# Patient Record
Sex: Male | Born: 1955
Health system: Southern US, Community
[De-identification: ages and names within clinical notes are randomized; demographics above are authoritative.]

## PROBLEM LIST (undated history)

## (undated) DIAGNOSIS — I10 Essential (primary) hypertension: Secondary | ICD-10-CM

## (undated) DIAGNOSIS — K219 Gastro-esophageal reflux disease without esophagitis: Secondary | ICD-10-CM

## (undated) DIAGNOSIS — L409 Psoriasis, unspecified: Secondary | ICD-10-CM

## (undated) HISTORY — DX: Psoriasis, unspecified: L40.9

## (undated) HISTORY — PX: UVULECTOMY: SHX2631

## (undated) HISTORY — DX: Essential (primary) hypertension: I10

## (undated) HISTORY — PX: TONSILLECTOMY: SUR1361

## (undated) HISTORY — PX: APPENDECTOMY: SHX54

## (undated) HISTORY — PX: NASAL SEPTUM SURGERY: SHX37

## (undated) HISTORY — DX: Gastro-esophageal reflux disease without esophagitis: K21.9

---

## 2014-09-08 ENCOUNTER — Ambulatory Visit (INDEPENDENT_AMBULATORY_CARE_PROVIDER_SITE_OTHER): Payer: 59 | Admitting: Family Medicine

## 2014-09-08 ENCOUNTER — Other Ambulatory Visit: Payer: Self-pay

## 2014-09-08 ENCOUNTER — Encounter: Payer: Self-pay | Admitting: Pharmacist

## 2014-09-08 ENCOUNTER — Ambulatory Visit (HOSPITAL_COMMUNITY)
Admission: RE | Admit: 2014-09-08 | Discharge: 2014-09-08 | Disposition: A | Discharge status: Home / Self Care | Payer: 59 | Source: Ambulatory Visit | Attending: Family Medicine | Admitting: Family Medicine

## 2014-09-08 ENCOUNTER — Ambulatory Visit
Admission: RE | Admit: 2014-09-08 | Discharge: 2014-09-08 | Disposition: A | Discharge status: Home / Self Care | Payer: 59 | Source: Ambulatory Visit | Attending: Family Medicine | Admitting: Family Medicine

## 2014-09-08 ENCOUNTER — Ambulatory Visit (INDEPENDENT_AMBULATORY_CARE_PROVIDER_SITE_OTHER): Payer: 59 | Admitting: Pharmacist

## 2014-09-08 VITALS — Ht 75.0 in | Wt 232.0 lb

## 2014-09-08 VITALS — BP 136/78

## 2014-09-08 DIAGNOSIS — Y9315 Activity, underwater diving and snorkeling: Secondary | ICD-10-CM

## 2014-09-08 DIAGNOSIS — Z0289 Encounter for other administrative examinations: Secondary | ICD-10-CM | POA: Diagnosis present

## 2014-09-08 DIAGNOSIS — Z025 Encounter for examination for participation in sport: Secondary | ICD-10-CM

## 2014-09-08 LAB — GLUCOSE, CAPILLARY: Glucose-Capillary: 102 mg/dL — ABNORMAL HIGH (ref 70–99)

## 2014-09-08 LAB — POCT UA - GLUCOSE/PROTEIN
Glucose, UA: NEGATIVE
PROTEIN UA: NEGATIVE

## 2014-09-08 LAB — POCT HEMOGLOBIN: HEMOGLOBIN: 13.6 g/dL — AB (ref 14.1–18.1)

## 2014-09-08 NOTE — Progress Notes (Signed)
S:    Patient arrives appearing in good spirits. Presents for lung function evaluation for "dive physical". Patient reports breathing has been normal.   Denies history or breathing difficulty.   O: Patient provided good effort while attempting spirometry.  FVC 5.28    Calculated Lower Limit for NOAA Diving Standards   FVC = 5.42 FEV1 4.56       Calculated Lower Limit for NOAA Diving Standards   FEV1= 3.47 FEV1/FVC 0.86  Calculated Lower Limit for NOAA Diving Standards   FEV1/FVC = 0.64     See "scanned report" or Documentation Flowsheet (discrete results - PFTs) for  Spirometry results and copy of evaluation.   A/P: Spirometry evaluation without bronchodilator reveals normal lung function.  FEV1, FVC and FEV1/FVC ratio are in the range of goal spirometric parameters. Reviewed results of pulmonary function tests with patient and provided to Dr. Mingo Amber for review.   Pt verbalized understanding of results and education.  Patient seen with Randell Patient, PharmD Candidate and Milus Glazier, PharmD Resident.

## 2014-09-08 NOTE — Progress Notes (Signed)
Subjective:    Lawrence Wang is a 58 y.o. male who presents to [facility] today for scuba diving physical for the [facility]:  1.  Diving physical:  First certified in [place] age 54 or 72, active diver since that time.  Denies any complications or injuries while diving.  Has never before taken a fitness to dive physical.  Has never had issues with physical in past.  Followed by physician here in [place].  Normal lipid studies there.  Currently well, without complaints.  The following portions of the patient's history were reviewed and updated as appropriate: allergies, current medications, past medical history, family and social history, and problem list.  PMH reviewed.  No past medical history on file. No past surgical history on file.  Medications reviewed. No current outpatient prescriptions on file.   No current facility-administered medications for this visit.     PMH:   - None to report - No other hospitalizations or other prior medical history   PSH: - tonsillectomy as a child.  Appendectomy as a child.    Family History: - noncontributory  Social: - Never smoker - Denies illicit drug use - Very occasional social drinker (1-2 drinks/week)  SCUBA ROS:  He denies any history of middle ear trauma/disease, vertigo, ocular surgery, asthma or other respiratory issues, seizures, loss of consciousness, recurring neurologic disorders, history of head injury, coagulopathies, evidence of CAD or other structural heart disease, pneumothorax, diabetes, or exercise intolerance.    General ROS:  The patient denies fever, unusual weight change, decreased hearing, chest pain, palpitations, pre-syncopal or syncopal episodes, dyspnea on exertion, prolonged cough, hemoptysis, change in bowel habits, melena, hematochezia, severe indigestion/heartburn, nausea/vomiting/abdominal pain, genital sores, muscle weakness, difficulty walking, abnormal bleeding, or enlarged lymph nodes.      Objective:   Physical Exam There were no vitals taken for this visit. Gen:  Alert, cooperative patient who appears stated age in no acute distress.  Vital signs reviewed. Head:  [place]/AT Eyes:  Fundoscopy WNL BL.  PERRL, EOMI Ears:  External ears WNL, Bilateral TM's normal without retraction, redness or bulging.  Canals clear BL  Mouth:  Good dental hygiene. Tonsils non-erythematous, non-edematous.   MMM Neck:  Trachea midline Cardiac:  Regular rate and rhythm without murmur auscultated.   Pulm:  Clear to auscultation bilaterally with good air movement throuhout.  No wheezes or rales noted.   Abd:  Soft/nondistended/nontender.  Good bowel sounds throughout all four quadrants.  No masses noted.  Exts: No edema BL LE's, warm and well-perfused Neuro:  Alert and oriented to person, place, and date.  CN II-XII intact.  Sensation intact to light touch and vibration bilateral upper and lower extremities equally.  Motor function equal and strength 5/5 bilateral upper and lower extremities.  Normal gait.  DTRs +2 BL patellar, and achilles.  Finger to nose cerebellar testing within normal limits.  Color vision testing normal. Psych:  Not depressed or anxious appearing.  Linear and coherent thought process as evidenced by speech pattern. Smiles spontaneously.   Results for orders placed or performed in visit on 09/08/14 (from the past 72 hour(s))  Glucose, capillary     Status: Abnormal   Collection Time: 09/08/14  9:49 AM  Result Value Ref Range   Glucose-Capillary 102 (H) 70 - 99 mg/dL

## 2014-09-08 NOTE — Patient Instructions (Signed)
Normal spirometry

## 2014-09-08 NOTE — Assessment & Plan Note (Signed)
Spirometry evaluation without bronchodilator reveals normal lung function.  FEV1, FVC and FEV1/FVC ratio are in the range of goal spirometric parameters. Reviewed results of pulmonary function tests with patient and provided to Dr. Mingo Amber for review.   Pt verbalized understanding of results and education.  Patient seen with Randell Patient, PharmD Candidate and Milus Glazier, PharmD Resident.

## 2014-09-08 NOTE — Assessment & Plan Note (Signed)
Vision (distance, near, color), hearing, UA, CBG, Hgb all within normal limits. Normal CXR today. Spirometry is borderline.  Cleared for year -- needs to be rechecked next year.   EKG:  NSR, no ischemia or other issues Approval for SCUBA diving, I find no medical conditions considered incompatible with diving. Due to age and borderline PFTs, he qualifies for 1 year certification.

## 2014-09-08 NOTE — Patient Instructions (Signed)
It was good to meet you today.  Follow up with me in a year.    I have completed your paperwork.

## 2014-09-11 NOTE — Progress Notes (Signed)
Patient ID: Lawrence Wang, male   DOB: 1956/02/02, 59 y.o.   MRN: [id number] Reviewed:  Agree with Dr. Graylin Shiver documentation and management.

## 2014-11-11 ENCOUNTER — Encounter: Payer: Self-pay | Admitting: Physician Assistant

## 2014-11-11 ENCOUNTER — Ambulatory Visit (INDEPENDENT_AMBULATORY_CARE_PROVIDER_SITE_OTHER): Payer: 59 | Admitting: Physician Assistant

## 2014-11-11 VITALS — BP 142/88 | HR 64 | Wt 234.0 lb

## 2014-11-11 DIAGNOSIS — R300 Dysuria: Secondary | ICD-10-CM | POA: Diagnosis not present

## 2014-11-11 DIAGNOSIS — Z87440 Personal history of urinary (tract) infections: Secondary | ICD-10-CM

## 2014-11-11 LAB — POCT URINALYSIS DIPSTICK
Bilirubin, UA: NEGATIVE
Glucose, UA: NEGATIVE
KETONES UA: NEGATIVE
Leukocytes, UA: NEGATIVE
Nitrite, UA: NEGATIVE
PH UA: 6.5
PROTEIN UA: NEGATIVE
RBC UA: NEGATIVE
UROBILINOGEN UA: 0.2

## 2014-11-11 LAB — POCT UA - MICROSCOPIC ONLY
Bacteria, U Microscopic: NEGATIVE
Casts, Ur, LPF, POC: NEGATIVE
Crystals, Ur, HPF, POC: NEGATIVE
Mucus, UA: NEGATIVE
RBC, URINE, MICROSCOPIC: NEGATIVE
WBC, UR, HPF, POC: NEGATIVE
Yeast, UA: NEGATIVE

## 2014-11-11 NOTE — Progress Notes (Signed)
11/11/2014 at 4:43 PM  Lawrence Wang / DOB: [birth year] / MRN: [id number]  The patient has Scuba diving on his problem list.  SUBJECTIVE  Chief compalaint: No chief complaint on file.   History of present illness: Lawrence Wang is 59 y.o. well appearing male presenting for UTI symptoms.  This started 2 days ago with dysuria but he denies frequency and urgency.  He denies suprapubic pressure. Today he reports only slight discomfort, and is feeling better.  He deneis fever, chills, nausea and vomiting. He had a laboratory confirmed UTI on 7/31//15 at another urgent care and was treated with Cipro.    He reports having a normal PSA as his normal physical last week.  He reports having a vasectomy 21 years ago.  He has been married 28 years and is in a monogamous relationship.   He  has no past medical history on file.    He currently has no medications in their medication list.  Lawrence Wang has no allergies on file. He  reports that he has never smoked. He does not have any smokeless tobacco history on file. He  has no sexual activity history on file. The patient  has no past surgical history on file.  His family history is not on file.  Review of Systems  Constitutional: Negative.   HENT: Negative.   Respiratory: Negative.   Cardiovascular: Negative.   Genitourinary: Positive for dysuria. Negative for urgency, frequency, hematuria and flank pain.  Musculoskeletal: Negative for myalgias.  Skin: Negative.   Neurological: Negative.     OBJECTIVE  His  weight is 234 lb (106.142 kg). His blood pressure is 142/88 and his pulse is 64.  The patient's body mass index is 29.25 kg/(m^2).  Physical Exam  Constitutional: He is oriented to person, place, and time. He appears well-developed and well-nourished.  Cardiovascular: Normal rate.   Respiratory: Effort normal.  GI: Soft. Bowel sounds are normal. He exhibits no distension and no mass. There is no tenderness. There is no rebound and no guarding.   Genitourinary: Testes normal. Hypospadias (minimal displacement) present. No phimosis, paraphimosis, penile erythema or penile tenderness. No discharge found.  Musculoskeletal: Normal range of motion.  Neurological: He is alert and oriented to person, place, and time.  Skin: Skin is warm and dry. No rash noted. No erythema. No pallor.  Psychiatric: He has a normal mood and affect. His behavior is normal. Thought content normal.    Results for orders placed or performed in visit on 11/11/14 (from the past 24 hour(s))  POCT urinalysis dipstick     Status: None   Collection Time: 11/11/14  4:39 PM  Result Value Ref Range   Color, UA yellow    Clarity, UA clear    Glucose, UA neg    Bilirubin, UA neg    Ketones, UA neg    Spec Grav, UA <=1.005    Blood, UA neg    pH, UA 6.5    Protein, UA neg    Urobilinogen, UA 0.2    Nitrite, UA neg    Leukocytes, UA Negative   POCT UA - Microscopic Only     Status: None   Collection Time: 11/11/14  4:39 PM  Result Value Ref Range   WBC, Ur, HPF, POC neg    RBC, urine, microscopic neg    Bacteria, U Microscopic neg    Mucus, UA neg    Epithelial cells, urine per micros 0-1    Crystals, Ur, HPF, POC neg  Casts, Ur, LPF, POC neg    Yeast, UA neg     ASSESSMENT & PLAN  Diagnoses and all orders for this visit:  History of UTI: Awaiting confirmatory documentation from the urgent care which he was treated.  They agreed to fax [facility] these labs.  Orders: -     POCT urinalysis dipstick -     POCT UA - Microscopic Only  Dysuria: Urine does not point towards a UTI.  Will send out labs.  A stone is certainly possible, however the patient denies a history of this and does not have pain today, and the picture is confusing given his history of confirmed UTI. Will proceed with the following labs and will await urine culture. Should he develop these symptoms again in the future advised that he establish with urology.   Orders: -     Comprehensive  metabolic panel -     Calcium, ionized -     CBC with Differential/Platelet -     Urine culture -     POCT urinalysis dipstick -     POCT UA - Microscopic Only   The patient was advised to call or come back to clinic if he does not see an improvement in symptoms, or worsens with the above plan.   Philis Fendt, MHS, PA-C [facility] 11/11/2014 4:43 PM

## 2014-11-12 LAB — COMPREHENSIVE METABOLIC PANEL
ALK PHOS: 15 U/L — AB (ref 39–117)
ALT: 21 U/L (ref 0–53)
AST: 18 U/L (ref 0–37)
Albumin: 4.2 g/dL (ref 3.5–5.2)
BILIRUBIN TOTAL: 0.5 mg/dL (ref 0.2–1.2)
BUN: 13 mg/dL (ref 6–23)
CALCIUM: 9.6 mg/dL (ref 8.4–10.5)
CO2: 28 mEq/L (ref 19–32)
Chloride: 100 mEq/L (ref 96–112)
Creat: 0.84 mg/dL (ref 0.50–1.35)
Glucose, Bld: 84 mg/dL (ref 70–99)
POTASSIUM: 4.2 meq/L (ref 3.5–5.3)
Sodium: 137 mEq/L (ref 135–145)
TOTAL PROTEIN: 6.6 g/dL (ref 6.0–8.3)

## 2014-11-12 LAB — CBC WITH DIFFERENTIAL/PLATELET
Basophils Absolute: 0 10*3/uL (ref 0.0–0.1)
Basophils Relative: 0 % (ref 0–1)
Eosinophils Absolute: 0.2 10*3/uL (ref 0.0–0.7)
Eosinophils Relative: 3 % (ref 0–5)
HCT: 41.4 % (ref 39.0–52.0)
HEMOGLOBIN: 13.6 g/dL (ref 13.0–17.0)
LYMPHS ABS: 2.6 10*3/uL (ref 0.7–4.0)
Lymphocytes Relative: 34 % (ref 12–46)
MCH: 26.7 pg (ref 26.0–34.0)
MCHC: 32.9 g/dL (ref 30.0–36.0)
MCV: 81.3 fL (ref 78.0–100.0)
MONO ABS: 0.8 10*3/uL (ref 0.1–1.0)
MONOS PCT: 10 % (ref 3–12)
MPV: 8.9 fL (ref 8.6–12.4)
NEUTROS ABS: 4 10*3/uL (ref 1.7–7.7)
Neutrophils Relative %: 53 % (ref 43–77)
Platelets: 293 10*3/uL (ref 150–400)
RBC: 5.09 MIL/uL (ref 4.22–5.81)
RDW: 16.5 % — AB (ref 11.5–15.5)
WBC: 7.6 10*3/uL (ref 4.0–10.5)

## 2014-11-12 LAB — CALCIUM, IONIZED: Calcium, Ion: 1.27 mmol/L (ref 1.12–1.32)

## 2014-11-13 LAB — URINE CULTURE
COLONY COUNT: NO GROWTH
ORGANISM ID, BACTERIA: NO GROWTH

## 2015-04-19 ENCOUNTER — Encounter: Payer: Self-pay | Admitting: Family Medicine

## 2015-04-19 ENCOUNTER — Ambulatory Visit (INDEPENDENT_AMBULATORY_CARE_PROVIDER_SITE_OTHER): Payer: 59 | Admitting: Family Medicine

## 2015-04-19 VITALS — BP 108/70 | HR 94 | Temp 98.2°F | Ht 75.25 in | Wt 231.6 lb

## 2015-04-19 DIAGNOSIS — Z7189 Other specified counseling: Secondary | ICD-10-CM

## 2015-04-19 DIAGNOSIS — I10 Essential (primary) hypertension: Secondary | ICD-10-CM | POA: Diagnosis not present

## 2015-04-19 DIAGNOSIS — Z7689 Persons encountering health services in other specified circumstances: Secondary | ICD-10-CM

## 2015-04-19 DIAGNOSIS — K219 Gastro-esophageal reflux disease without esophagitis: Secondary | ICD-10-CM | POA: Diagnosis not present

## 2015-04-19 NOTE — Progress Notes (Signed)
Pre visit review using our clinic review tool, if applicable. No additional management support is needed unless otherwise documented below in the visit note. 

## 2015-04-19 NOTE — Progress Notes (Signed)
HPI:  Lawrence Wang is here to establish care. Was seeing Dr. Marlyn Corporal at [facility] but wife comes here. Had physical 2 months ago with labs.  Has the following chronic problems that require follow up and concerns today:  HTN: -reports diagnosed years ago and on benazepril -he wonders if he even needs the medication now - exercises more now -swims 3-4 days per week for 1 hour; diet is ok -denies: CP, SOB, DOE -denies any hx of kidney dz, heart problems, diabetes  GERD: -started about 5 years ago and has been on prilosec 20 mg daily for this -drinks a lot of caffeine -when tries to stop prilosec has bad heartburn -denies: hiatal hernia, esophageal sphincter, dysphagia  ROS negative for unless reported above: fevers, unintentional weight loss, hearing or vision loss, chest pain, palpitations, struggling to breath, hemoptysis, melena, hematochezia, hematuria, falls, loc, si, thoughts of self harm  Past Medical History  Diagnosis Date  . Hypertension   . GERD (gastroesophageal reflux disease)     Past Surgical History  Procedure Laterality Date  . Appendectomy    . Tonsillectomy    . Uvulectomy    . Nasal septum surgery      Family History  Problem Relation Age of Onset  . Heart block Father   . Cervical cancer Mother 65    Social History   Social History  . Marital Status: Married    Spouse Name: N/A  . Number of Children: N/A  . Years of Education: N/A   Social History Main Topics  . Smoking status: Never Smoker   . Smokeless tobacco: None  . Alcohol Use: 0.0 oz/week    0 Standard drinks or equivalent per week     Comment: glass of wine 4 nights per week  . Drug Use: No  . Sexual Activity: Not Asked   Other Topics Concern  . 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  benazepril (LOTENSIN) 20 MG tablet, Take 20 mg by mouth daily., Disp: , Rfl:  .  Flaxseed, Linseed, (FLAX SEEDS PO), Take by mouth., Disp: , Rfl:  .  Multiple Vitamin (MULTIVITAMIN) capsule, Take 1 capsule by mouth daily., Disp: , Rfl:  .  omeprazole (PRILOSEC) 20 MG capsule, Take 20 mg by mouth daily., Disp: , Rfl:   EXAM:  Filed Vitals:   04/19/15 0924  BP: 108/70  Pulse: 94  Temp: 98.2 F (36.8 C)    Body mass index is 28.77 kg/(m^2).  GENERAL: vitals reviewed and listed above, alert, oriented, appears well hydrated and in no acute distress  HEENT: atraumatic, conjunttiva clear, no obvious abnormalities on inspection of external nose and ears  NECK: no obvious masses on inspection  LUNGS: clear to auscultation bilaterally, no wheezes, rales or rhonchi, good air movement  CV: HRRR, no peripheral edema  MS: moves all extremities without noticeable abnormality  PSYCH: pleasant and cooperative, no obvious depression or anxiety  ASSESSMENT AND PLAN:  Discussed the following assessment and plan:  Essential hypertension -BP looks great today and he is doing aggressive exercise -he opted to try trial of BP medication, has cuff at home -advised to monitor  home BP 3 days per week and follow up nurse visit in 2 weeks to recheck here off medication  Gastroesophageal reflux disease, esophagitis presence not specified -discussed risks/benefits longterm PPI use -advised lifestyle changes -may try stoppin PPI or ranitidine if tolerates -follow up as needed  Encounter to establish care with new doctor -We reviewed the PMH, PSH, FH, SH, Meds and Allergies. -We provided refills for any medications we will prescribe as needed. -We addressed current concerns per orders and patient instructions. -We have asked for records for pertinent exams, studies, vaccines and notes from previous providers. -We have  advised patient to follow up per instructions below. -offered flu vaccine, he plans to do at follow up BP check   Asked him to obtain and provide vaccine record and recent labs/studies from prior PCP.  -Patient advised to return or notify a doctor immediately if symptoms worsen or persist or new concerns arise.  Patient Instructions  BEFORE YOU LEAVE: -schedule nurse visit in 2-4 weeks to recheck blood pressure of the medication -schedule follow up with me yearly  Try zantac(ranitidine) 75 mg and/or lifestyle changes for acid reflux instead of prilosec if tolerated  We recommend the following healthy lifestyle measures: - eat a healthy diet consisting of lots of vegetables, fruits, beans, nuts, seeds, healthy meats such as white chicken and fish and whole grains.  - avoid fried foods, fast food, processed foods, sodas, red meet and other fattening foods.  - get a least 150 minutes of aerobic exercise per week.   Food Choices for Gastroesophageal Reflux Disease When you have gastroesophageal reflux disease (GERD), the foods you eat and your eating habits are very important. Choosing the right foods can help ease the discomfort of GERD. WHAT GENERAL GUIDELINES DO I NEED TO FOLLOW?  Choose fruits, vegetables, whole grains, low-fat dairy products, and low-fat meat, fish, and poultry.  Limit fats such as oils, salad dressings, butter, nuts, and avocado.  Keep a food diary to identify foods that cause symptoms.  Avoid foods that cause reflux. These may be different for different people.  Eat frequent small meals instead of three large meals each day.  Eat your meals slowly, in a relaxed setting.  Limit fried foods.  Cook foods using methods other than frying.  Avoid drinking alcohol.  Avoid drinking large amounts of liquids with your meals.  Avoid bending over or lying down until 2-3 hours after eating. WHAT FOODS ARE NOT RECOMMENDED? The following are some foods and drinks  that may worsen your symptoms: Vegetables Tomatoes. Tomato juice. Tomato and spaghetti sauce. Chili peppers. Onion and garlic. Horseradish. Fruits Oranges, grapefruit, and lemon (fruit and juice). Meats High-fat meats, fish, and poultry. This includes hot dogs, ribs, ham, sausage, salami, and bacon. Dairy Whole milk and chocolate milk. Sour cream. Cream. Butter. Ice cream. Cream cheese.  Beverages Coffee and tea, with or without caffeine. Carbonated beverages or energy drinks. Condiments Hot sauce. Barbecue sauce.  Sweets/Desserts Chocolate and cocoa. Donuts. Peppermint and spearmint. Fats and Oils High-fat foods, including [place] fries and potato chips. Other Vinegar. Strong spices, such as black pepper, white pepper, red pepper, cayenne, curry powder, cloves, ginger, and chili powder. The items listed above may not be a complete list of foods and beverages to avoid. Contact your dietitian for more information. Document Released: 08/11/2005 Document Revised: 08/16/2013 Document Reviewed: 06/15/2013 [facility] Patient Information 2015 [place], [place]. This information is not intended to replace advice given to you by your health care provider. Make sure  you discuss any questions you have with your health care provider.      Colin Benton R.

## 2015-04-19 NOTE — Patient Instructions (Signed)
BEFORE YOU LEAVE: -schedule nurse visit in 2-4 weeks to recheck blood pressure of the medication -schedule follow up with me yearly  Try zantac(ranitidine) 75 mg and/or lifestyle changes for acid reflux instead of prilosec if tolerated  We recommend the following healthy lifestyle measures: - eat a healthy diet consisting of lots of vegetables, fruits, beans, nuts, seeds, healthy meats such as white chicken and fish and whole grains.  - avoid fried foods, fast food, processed foods, sodas, red meet and other fattening foods.  - get a least 150 minutes of aerobic exercise per week.   Food Choices for Gastroesophageal Reflux Disease When you have gastroesophageal reflux disease (GERD), the foods you eat and your eating habits are very important. Choosing the right foods can help ease the discomfort of GERD. WHAT GENERAL GUIDELINES DO I NEED TO FOLLOW?  Choose fruits, vegetables, whole grains, low-fat dairy products, and low-fat meat, fish, and poultry.  Limit fats such as oils, salad dressings, butter, nuts, and avocado.  Keep a food diary to identify foods that cause symptoms.  Avoid foods that cause reflux. These may be different for different people.  Eat frequent small meals instead of three large meals each day.  Eat your meals slowly, in a relaxed setting.  Limit fried foods.  Cook foods using methods other than frying.  Avoid drinking alcohol.  Avoid drinking large amounts of liquids with your meals.  Avoid bending over or lying down until 2-3 hours after eating. WHAT FOODS ARE NOT RECOMMENDED? The following are some foods and drinks that may worsen your symptoms: Vegetables Tomatoes. Tomato juice. Tomato and spaghetti sauce. Chili peppers. Onion and garlic. Horseradish. Fruits Oranges, grapefruit, and lemon (fruit and juice). Meats High-fat meats, fish, and poultry. This includes hot dogs, ribs, ham, sausage, salami, and bacon. Dairy Whole milk and chocolate milk.  Sour cream. Cream. Butter. Ice cream. Cream cheese.  Beverages Coffee and tea, with or without caffeine. Carbonated beverages or energy drinks. Condiments Hot sauce. Barbecue sauce.  Sweets/Desserts Chocolate and cocoa. Donuts. Peppermint and spearmint. Fats and Oils High-fat foods, including [place] fries and potato chips. Other Vinegar. Strong spices, such as black pepper, white pepper, red pepper, cayenne, curry powder, cloves, ginger, and chili powder. The items listed above may not be a complete list of foods and beverages to avoid. Contact your dietitian for more information. Document Released: 08/11/2005 Document Revised: 08/16/2013 Document Reviewed: 06/15/2013 [facility] Patient Information 2015 [place], [place]. This information is not intended to replace advice given to you by your health care provider. Make sure you discuss any questions you have with your health care provider.

## 2015-05-02 ENCOUNTER — Encounter: Payer: 59 | Admitting: *Deleted

## 2015-05-02 NOTE — Progress Notes (Deleted)
  HPI:  ROS: See pertinent positives and negatives per HPI.  Past Medical History  Diagnosis Date  . Hypertension   . GERD (gastroesophageal reflux disease)     Past Surgical History  Procedure Laterality Date  . Appendectomy    . Tonsillectomy    . Uvulectomy    . Nasal septum surgery      Family History  Problem Relation Age of Onset  . Heart block Father   . Cervical cancer Mother 52    Social History   Social History  . Marital Status: Married    Spouse Name: N/A  . Number of Children: N/A  . Years of Education: N/A   Social History Main Topics  . Smoking status: Never Smoker   . Smokeless tobacco: Not on file  . Alcohol Use: 0.0 oz/week    0 Standard drinks or equivalent per week     Comment: glass of wine 4 nights per week  . Drug Use: No  . Sexual Activity: Not on file   Other Topics Concern  . 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  benazepril (LOTENSIN) 20 MG tablet, Take 20 mg by mouth daily., Disp: , Rfl:  .  Flaxseed, Linseed, (FLAX SEEDS PO), Take by mouth., Disp: , Rfl:  .  Multiple Vitamin (MULTIVITAMIN) capsule, Take 1 capsule by mouth daily., Disp: , Rfl:  .  omeprazole (PRILOSEC) 20 MG capsule, Take 20 mg by mouth daily., Disp: , Rfl:   EXAM:  There were no vitals filed for this visit.  There is no weight on file to calculate BMI.  GENERAL: vitals reviewed and listed above, alert, oriented, appears well hydrated and in no acute distress  HEENT: atraumatic, conjunttiva clear, no obvious abnormalities on inspection of external nose and ears  NECK: no obvious masses on inspection  LUNGS: clear to auscultation bilaterally, no wheezes, rales or rhonchi, good air  movement  CV: HRRR, no peripheral edema  MS: moves all extremities without noticeable abnormality  PSYCH: pleasant and cooperative, no obvious depression or anxiety  ASSESSMENT AND PLAN:  Discussed the following assessment and plan:  No diagnosis found.  -Patient advised to return or notify a doctor immediately if symptoms worsen or persist or new concerns arise.  There are no Patient Instructions on file for this visit.   Colin Benton R.

## 2015-05-02 NOTE — Progress Notes (Signed)
This encounter was created in error - please disregard.

## 2015-08-02 ENCOUNTER — Ambulatory Visit (INDEPENDENT_AMBULATORY_CARE_PROVIDER_SITE_OTHER): Payer: 59 | Admitting: Family Medicine

## 2015-08-02 ENCOUNTER — Encounter: Payer: Self-pay | Admitting: Family Medicine

## 2015-08-02 VITALS — BP 132/84 | HR 88 | Temp 98.2°F | Ht 75.25 in | Wt 235.1 lb

## 2015-08-02 DIAGNOSIS — K219 Gastro-esophageal reflux disease without esophagitis: Secondary | ICD-10-CM | POA: Diagnosis not present

## 2015-08-02 DIAGNOSIS — I10 Essential (primary) hypertension: Secondary | ICD-10-CM

## 2015-08-02 NOTE — Progress Notes (Signed)
Pre visit review using our clinic review tool, if applicable. No additional management support is needed unless otherwise documented below in the visit note. 

## 2015-08-02 NOTE — Progress Notes (Signed)
  HPI:  HTN: -on benazepril for years with prior PCP then changed lifestyle and wanted to try trial off, here to recheck -swims 3-4 days per week for 1 hour; diet is ok -denies: CP, SOB, DOE -denies any hx of kidney dz, heart problems, diabetes -reports BP at home in 12--130s/80s, occ 90  GERD: -started about 5 years ago and has been on prilosec 20 mg daily for this -drinks a lot of caffeine -when tries to stop prilosec has bad heartburn -denies: hiatal hernia, esophageal sphincter, dysphagia   ROS: See pertinent positives and negatives per HPI.  Past Medical History  Diagnosis Date  . Hypertension   . GERD (gastroesophageal reflux disease)     Past Surgical History  Procedure Laterality Date  . Appendectomy    . Tonsillectomy    . Uvulectomy    . Nasal septum surgery      Family History  Problem Relation Age of Onset  . Heart block Father   . Cervical cancer Mother 8    Social History   Social History  . Marital Status: Married    Spouse Name: N/A  . Number of Children: N/A  . Years of Education: N/A   Social History Main Topics  . Smoking status: Never Smoker   . Smokeless tobacco: None  . Alcohol Use: 0.0 oz/week    0 Standard drinks or equivalent per week     Comment: glass of wine 4 nights per week  . Drug Use: No  . Sexual Activity: Not Asked   Other Topics Concern  . 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  Flaxseed, Linseed, (FLAX SEEDS PO), Take by mouth., Disp: , Rfl:  .  Multiple Vitamin (MULTIVITAMIN) capsule, Take 1 capsule by mouth daily., Disp: , Rfl:  .  omeprazole (PRILOSEC) 20 MG capsule, Take 20 mg by mouth daily., Disp: , Rfl:   EXAM:  Filed Vitals:   08/02/15 0909   BP: 132/84  Pulse: 88  Temp: 98.2 F (36.8 C)    Body mass index is 29.2 kg/(m^2).  GENERAL: vitals reviewed and listed above, alert, oriented, appears well hydrated and in no acute distress  HEENT: atraumatic, conjunttiva clear, no obvious abnormalities on inspection of external nose and ears  NECK: no obvious masses on inspection  LUNGS: clear to auscultation bilaterally, no wheezes, rales or rhonchi, good air movement  CV: HRRR, no peripheral edema  MS: moves all extremities without noticeable abnormality  PSYCH: pleasant and cooperative, no obvious depression or anxiety  ASSESSMENT AND PLAN:  Discussed the following assessment and plan:  Essential hypertension  Gastroesophageal reflux disease, esophagitis presence not specified  -BP ok, borderline -discussed restarting BP meds versus continue with improved lifestyle changes and monitoring - opted to monitor -physical in 3-4 months -Patient advised to return or notify a doctor immediately if symptoms worsen or persist or new concerns arise.  There are no Patient Instructions on file for this visit.   Colin Benton R.

## 2015-10-03 DIAGNOSIS — R0982 Postnasal drip: Secondary | ICD-10-CM | POA: Diagnosis not present

## 2015-10-03 DIAGNOSIS — J019 Acute sinusitis, unspecified: Secondary | ICD-10-CM | POA: Diagnosis not present

## 2015-10-25 ENCOUNTER — Ambulatory Visit (INDEPENDENT_AMBULATORY_CARE_PROVIDER_SITE_OTHER): Payer: 59 | Admitting: Family Medicine

## 2015-10-25 ENCOUNTER — Encounter: Payer: Self-pay | Admitting: Family Medicine

## 2015-10-25 VITALS — BP 132/88 | HR 95 | Temp 98.1°F | Ht 75.25 in | Wt 233.5 lb

## 2015-10-25 DIAGNOSIS — J0100 Acute maxillary sinusitis, unspecified: Secondary | ICD-10-CM | POA: Diagnosis not present

## 2015-10-25 MED ORDER — AMOXICILLIN-POT CLAVULANATE 875-125 MG PO TABS: 1.0000 | ORAL_TABLET | Freq: Two times a day (BID) | ORAL | Status: DC

## 2015-10-25 MED FILL — AMOX TR-K CLV 875-125 MG TA: 875-125 | 10 days supply | Qty: 20 | Fill #0

## 2015-10-25 NOTE — Patient Instructions (Signed)
Reason take the antibiotic as instructed.  Start Afrin nasal spray twice daily for 5 days. Must do not use longer than 5 days.  Follow-up as needed.

## 2015-10-25 NOTE — Progress Notes (Signed)
HPI:  Lawrence Wang is a pleasant 60 year old here for an acute visit for sinusitis: -started: 3 weeks ago, now worsening -symptoms: Thick yellow nasal congestion, ear pressure and pain, sore throat, cough, now with intermittent maxillary sinus pain and tooth sensitivity -denies:fever, SOB, NVD -has tried: Nasal saline rinses and over-the-counter products -sick contacts/travel/risks: no reported flu, strep or tick exposure -He is going on a cruise in a few days ROS: See pertinent positives and negatives per HPI.  Past Medical History  Diagnosis Date  . Hypertension   . GERD (gastroesophageal reflux disease)     Past Surgical History  Procedure Laterality Date  . Appendectomy    . Tonsillectomy    . Uvulectomy    . Nasal septum surgery      Family History  Problem Relation Age of Onset  . Heart block Father   . Cervical cancer Mother 33    Social History   Social History  . Marital Status: Married    Spouse Name: N/A  . Number of Children: N/A  . Years of Education: N/A   Social History Main Topics  . Smoking status: Never Smoker   . Smokeless tobacco: None  . Alcohol Use: 0.0 oz/week    0 Standard drinks or equivalent per week     Comment: glass of wine 4 nights per week  . Drug Use: No  . Sexual Activity: Not Asked   Other Topics Concern  . 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  Flaxseed, Linseed, (FLAX SEEDS PO), Take by mouth., Disp: , Rfl:  .  Multiple Vitamin (MULTIVITAMIN) capsule, Take 1 capsule by mouth daily., Disp: , Rfl:  .  omeprazole (PRILOSEC) 20 MG capsule, Take 20 mg by mouth every other day. Alternates with Zantac, Disp: , Rfl:  .  RaNITidine HCl (ZANTAC PO), Take by mouth every other  day. Alternates with Prilosec, Disp: , Rfl:  .  amoxicillin-clavulanate (AUGMENTIN) 875-125 MG tablet, Take 1 tablet by mouth 2 (two) times daily., Disp: 20 tablet, Rfl: 0  EXAM:  Filed Vitals:   10/25/15 1604  BP: 132/88  Pulse: 95  Temp: 98.1 F (36.7 C)    Body mass index is 29 kg/(m^2).  GENERAL: vitals reviewed and listed above, alert, oriented, appears well hydrated and in no acute distress  HEENT: atraumatic, conjunttiva clear, no obvious abnormalities on inspection of external nose and ears, normal appearance of ear canals and TMs except for clear effusion on the left, R>L thick white nasal congestion, mild post oropharyngeal erythema with PND, no tonsillar edema or exudate, no sinus TTP  NECK: no obvious masses on inspection  LUNGS: clear to auscultation bilaterally, no wheezes, rales or rhonchi, good air movement  CV: HRRR, no peripheral edema  MS: moves all extremities without noticeable abnormality  PSYCH: pleasant and cooperative, no obvious depression or anxiety  ASSESSMENT AND PLAN:  Discussed the following assessment and plan:  Acute maxillary sinusitis, recurrence not specified  -Suspect maxillary sinusitis with eustachian tube dysfunction. Treat with antibiotic and short course Afrin nasal spray. -of course, we advised to return or notify a doctor immediately if symptoms worsen or persist or  new concerns arise.    Patient Instructions  Reason take the antibiotic as instructed.  Start Afrin nasal spray twice daily for 5 days. Must do not use longer than 5 days.  Follow-up as needed.     Colin Benton R.

## 2015-10-25 NOTE — Progress Notes (Signed)
Pre visit review using our clinic review tool, if applicable. No additional management support is needed unless otherwise documented below in the visit note. 

## 2016-01-30 ENCOUNTER — Encounter: Payer: 59 | Admitting: Family Medicine

## 2016-02-29 DIAGNOSIS — H524 Presbyopia: Secondary | ICD-10-CM | POA: Diagnosis not present

## 2016-03-13 ENCOUNTER — Ambulatory Visit (INDEPENDENT_AMBULATORY_CARE_PROVIDER_SITE_OTHER): Payer: 59 | Admitting: Family Medicine

## 2016-03-13 VITALS — BP 126/80 | HR 84 | Wt 230.3 lb

## 2016-03-13 DIAGNOSIS — Z299 Encounter for prophylactic measures, unspecified: Secondary | ICD-10-CM

## 2016-03-13 DIAGNOSIS — Z418 Encounter for other procedures for purposes other than remedying health state: Secondary | ICD-10-CM

## 2016-03-13 DIAGNOSIS — Z23 Encounter for immunization: Secondary | ICD-10-CM | POA: Diagnosis not present

## 2016-03-13 DIAGNOSIS — K219 Gastro-esophageal reflux disease without esophagitis: Secondary | ICD-10-CM

## 2016-03-13 DIAGNOSIS — I10 Essential (primary) hypertension: Secondary | ICD-10-CM | POA: Insufficient documentation

## 2016-03-13 DIAGNOSIS — Z0000 Encounter for general adult medical examination without abnormal findings: Secondary | ICD-10-CM

## 2016-03-13 LAB — LIPID PANEL
CHOL/HDL RATIO: 5
CHOLESTEROL: 205 mg/dL — AB (ref 0–200)
HDL: 41.4 mg/dL (ref >39.00)
LDL CALC: 135 mg/dL — AB (ref 0–99)
NonHDL: 163.62
TRIGLYCERIDES: 145 mg/dL (ref 0.0–149.0)
VLDL: 29 mg/dL (ref 0.0–40.0)

## 2016-03-13 LAB — HEMOGLOBIN A1C: Hgb A1c MFr Bld: 5.5 % (ref 4.6–6.5)

## 2016-03-13 NOTE — Progress Notes (Signed)
HPI:  Here for CPE:  -Concerns and/or follow up today: none  -Diet: variety of foods, balance and well rounded, larger portion sizes  -Exercise: regular exercise - swimmer  -Diabetes and Dyslipidemia Screening:  -Hx of HTN: no  -Vaccines:due for Tdap  -wants STI testing, Hep C screening (if born 55-1963): no  -FH colon or prstate ca: see FH Last colon cancer screening: utd Last prostate ca screening: declined after discussion risks/benefits  -Alcohol, Tobacco, drug use: see social history  Review of Systems - no fevers, unintentional weight loss, vision loss, hearing loss, chest pain, sob, hemoptysis, melena, hematochezia, hematuria, genital discharge, changing or concerning skin lesions, bleeding, bruising, loc, thoughts of self harm or SI  Past Medical History  Diagnosis Date  . Hypertension   . GERD (gastroesophageal reflux disease)     Past Surgical History  Procedure Laterality Date  . Appendectomy    . Tonsillectomy    . Uvulectomy    . Nasal septum surgery      Family History  Problem Relation Age of Onset  . Heart block Father   . Cervical cancer Mother 19    Social History   Social History  . Marital Status: Married    Spouse Name: N/A  . Number of Children: N/A  . Years of Education: N/A   Social History Main Topics  . Smoking status: Never Smoker   . Smokeless tobacco: Not on file  . Alcohol Use: 0.0 oz/week    0 Standard drinks or equivalent per week     Comment: glass of wine 4 nights per week  . Drug Use: No  . Sexual Activity: Not on file   Other Topics Concern  . 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  amoxicillin-clavulanate (AUGMENTIN) 875-125 MG tablet,  Take 1 tablet by mouth 2 (two) times daily., Disp: 20 tablet, Rfl: 0 .  Flaxseed, Linseed, (FLAX SEEDS PO), Take by mouth., Disp: , Rfl:  .  Multiple Vitamin (MULTIVITAMIN) capsule, Take 1 capsule by mouth daily., Disp: , Rfl:  .  omeprazole (PRILOSEC) 20 MG capsule, Take 20 mg by mouth every other day. Alternates with Zantac, Disp: , Rfl:  .  RaNITidine HCl (ZANTAC PO), Take by mouth every other day. Alternates with Prilosec, Disp: , Rfl:   EXAM:  Filed Vitals:   03/13/16 0745  BP: 126/80  Pulse: 84  Weight: 230 lb 4.8 oz (104.463 kg)    Estimated body mass index is 28.6 kg/(m^2) as calculated from the following:   Height as of 10/25/15: 6' 3.25" (1.911 m).   Weight as of this encounter: 230 lb 4.8 oz (104.463 kg).  GENERAL: vitals reviewed and listed below, alert, oriented, appears well hydrated and in no acute distress  HEENT: head atraumatic, PERRLA, normal appearance of eyes, ears, nose and mouth. moist mucus membranes.  NECK: supple, no masses or lymphadenopathy  LUNGS: clear to auscultation bilaterally, no rales, rhonchi or wheeze  CV: HRRR, no peripheral edema or cyanosis, normal pedal pulses  ABDOMEN: bowel sounds normal, soft, non tender to palpation, no masses, no rebound or guarding  GU: declined  RECTAL: refused  SKIN: no rash or abnormal lesions; declined full skin exam  MS: normal gait, moves all extremities normally  NEURO: normal gait, speech and thought processing grossly intact, muscle tone grossly intact throughout  PSYCH: normal affect, pleasant and cooperative  ASSESSMENT AND PLAN:  Discussed the following assessment and plan: Visit for preventive health examination - Plan: Lipid panel, Hemoglobin A1c  Gastroesophageal reflux disease, esophagitis presence not specified stable  -Discussed and advised all [place] preventive services health task force level A and B recommendations for age, sex and risks.  --Advised at least 150 minutes of exercise per  week and a healthy diet with avoidance of (less then 1 serving per week) processed foods, white starches, red meat, fast foods and sweets and consisting of: * 5-9 servings of fresh fruits and vegetables (not corn or potatoes) *nuts and seeds, beans *olives and olive oil *lean meats such as fish and white chicken  *whole grains  -FASTING labs, studies and vaccines per orders this encounter   Patient advised to return to clinic immediately if symptoms worsen or persist or new concerns.  Patient Instructions  BEFORE YOU LEAVE: -follow up: yearly and as needed -tdap -labs  Monitor Blood pressure every few months   We recommend the following healthy lifestyle: 1) Small portions - eat off of salad plate instead of dinner plate 2) Eat a healthy clean diet with avoidance of (less then 1 serving per week) processed foods, sweetened drinks, white starches, red meat, fast foods and sweets and consisting of: * 5-9 servings per day of fresh or frozen fruits and vegetables (not corn or potatoes, not dried or canned) *nuts and seeds, beans *olives and olive oil *small portions of lean meats such as fish and white chicken  *small portions of whole grains 3)Get at least 150 minutes of sweaty aerobic exercise per week 4)reduce stress - counseling, meditation, relaxation to balance other aspects of your life  We have ordered labs or studies at this visit. It can take up to 1-2 weeks for results and processing. IF results require follow up or explanation, we will call you with instructions. Clinically stable results will be released to your [facility]. If you have not heard from [place] or cannot find your results in [facility] in 2 weeks please contact our office at [phone].  If you are not yet signed up for [facility], please consider signing up.            No Follow-up on file.   Colin Benton R., DO

## 2016-03-13 NOTE — Patient Instructions (Signed)
BEFORE YOU LEAVE: -follow up: yearly and as needed -tdap -labs  Monitor Blood pressure every few months   We recommend the following healthy lifestyle: 1) Small portions - eat off of salad plate instead of dinner plate 2) Eat a healthy clean diet with avoidance of (less then 1 serving per week) processed foods, sweetened drinks, white starches, red meat, fast foods and sweets and consisting of: * 5-9 servings per day of fresh or frozen fruits and vegetables (not corn or potatoes, not dried or canned) *nuts and seeds, beans *olives and olive oil *small portions of lean meats such as fish and white chicken  *small portions of whole grains 3)Get at least 150 minutes of sweaty aerobic exercise per week 4)reduce stress - counseling, meditation, relaxation to balance other aspects of your life  We have ordered labs or studies at this visit. It can take up to 1-2 weeks for results and processing. IF results require follow up or explanation, we will call you with instructions. Clinically stable results will be released to your [facility]. If you have not heard from [place] or cannot find your results in [facility] in 2 weeks please contact our office at [phone].  If you are not yet signed up for [facility], please consider signing up.

## 2016-03-13 NOTE — Addendum Note (Signed)
Addended by: Jerl Santos R on: 03/13/2016 07:59 AM   Modules accepted: Orders

## 2016-06-13 ENCOUNTER — Ambulatory Visit (INDEPENDENT_AMBULATORY_CARE_PROVIDER_SITE_OTHER): Payer: 59 | Admitting: Pharmacist

## 2016-06-13 ENCOUNTER — Ambulatory Visit (HOSPITAL_COMMUNITY)
Admission: RE | Admit: 2016-06-13 | Discharge: 2016-06-13 | Disposition: A | Discharge status: Home / Self Care | Payer: 59 | Source: Ambulatory Visit | Attending: Family Medicine | Admitting: Family Medicine

## 2016-06-13 ENCOUNTER — Other Ambulatory Visit: Payer: Self-pay

## 2016-06-13 ENCOUNTER — Ambulatory Visit (INDEPENDENT_AMBULATORY_CARE_PROVIDER_SITE_OTHER): Payer: 59 | Admitting: Family Medicine

## 2016-06-13 ENCOUNTER — Encounter: Payer: Self-pay | Admitting: Pharmacist

## 2016-06-13 ENCOUNTER — Encounter: Payer: Self-pay | Admitting: Family Medicine

## 2016-06-13 VITALS — BP 139/83 | HR 79 | Ht 76.5 in | Wt 227.8 lb

## 2016-06-13 DIAGNOSIS — Z0000 Encounter for general adult medical examination without abnormal findings: Secondary | ICD-10-CM | POA: Diagnosis not present

## 2016-06-13 DIAGNOSIS — Y9315 Activity, underwater diving and snorkeling: Secondary | ICD-10-CM

## 2016-06-13 LAB — POCT UA - GLUCOSE/PROTEIN: GLUCOSE UA: NEGATIVE

## 2016-06-13 LAB — POCT HEMOGLOBIN: Hemoglobin: 15.3 g/dL (ref 14.1–18.1)

## 2016-06-13 LAB — GLUCOSE, CAPILLARY: GLUCOSE-CAPILLARY: 113 mg/dL — AB (ref 65–99)

## 2016-06-13 NOTE — Progress Notes (Signed)
S:    Patient arrives in no apparent distress, ambulating on own with no noticeable alterations. Presents for lung function evaluation for "dive physical". Patient reports breathing has been normal. Denies history of breathing difficulty.   O: Patient provided good effort while attempting spirometry.  FVC 5.23    Calculated Lower Limit for [employer] Diving Standards   FVC = 5.45 FEV1 4.42       Calculated Lower Limit for [employer] Diving Standards   FEV1= 3.43 FEV1/FVC  85   Calculated Lower Limit for [employer] Diving Standards   FEV1/FVC = 63  See "scanned report" or Documentation Flowsheet (discrete results - PFTs) for  Spirometry results and copy of evaluation.   A/P: Spirometry evaluation without bronchodilator reveals near normal lung function.  FEV1 and FEV1/FVC ratio both exceed threshold for spirometric parameters. FVC within 0.2 L of goal.  Reviewed results of pulmonary function tests.  Pt verbalized understanding of results and education.  Patient seen with Shonna Chock, PharmD Candidate.  Remainder of dive physical evaluation performed by Dr. Mingo Amber.

## 2016-06-13 NOTE — Patient Instructions (Signed)
Lung Function Assessment completed successfully.

## 2016-06-13 NOTE — Assessment & Plan Note (Signed)
Vision (distance, near, color), hearing, UA, CBG, Hgb, and Spirometry all within normal limits. Normal CXR within past 5 years. EKG:  Normal today; normal last visit. Coronary assessment:  Low risk. Borderline spirometry; same as last year, no decline.  Otherwise doing well and diving each week for past year without incident. Approval for SCUBA diving, I find no medical conditions considered incompatible with diving. Due to age and lack of medical conditions, he qualifies for 1 year certification.  Repeat spirometry in a year.

## 2016-06-13 NOTE — Progress Notes (Signed)
Subjective:    Lawrence Wang is a 60 y.o. male who presents to [facility] today for scuba diving physical for the [facility]:  1.  Diving physical:  First certified in [place] diving as teenager, active diver since that time.  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    Denies any complications or injuries while diving.  Has never failed a fitness to dive physical.  First was here last year, had borderline spirometry but otherwise normal EKG and normal CXR.  Currently well, without complaints.   PMH reviewed.  Past Medical History:  Diagnosis Date  . GERD (gastroesophageal reflux disease)   . Hypertension    Past Surgical History:  Procedure Laterality Date  . APPENDECTOMY    . NASAL SEPTUM SURGERY    . TONSILLECTOMY    . UVULECTOMY      Medications reviewed. Current Outpatient Prescriptions  Medication Sig Dispense Refill  . Flaxseed, Linseed, (FLAX SEEDS PO) Take by mouth.    . Multiple Vitamin (MULTIVITAMIN) capsule Take 1 capsule by mouth daily.    Marland Kitchen omeprazole (PRILOSEC) 20 MG capsule Take 20 mg by mouth every other day. Alternates with Zantac    . RaNITidine HCl (ZANTAC PO) Take by mouth every other day. Alternates with Prilosec     No current facility-administered medications for this visit.    Family History: - Cervical cancer in mother - Heart block in father.    Social: - Never smoker - Denies illicit drug use - Very occasional social drinker (several drinks wine per week)  SCUBA ROS:  He denies any history of middle ear trauma/disease, vertigo, ocular surgery, asthma or other respiratory issues, seizures, loss of consciousness, recurring neurologic disorders, history of head injury, coagulopathies, evidence of CAD or other structural heart disease, pneumothorax, diabetes, or exercise intolerance.    General ROS:  The patient denies fever, unusual weight change, decreased hearing, chest pain, palpitations, pre-syncopal or  syncopal episodes, dyspnea on exertion, prolonged cough, hemoptysis, change in bowel habits, melena, hematochezia, severe indigestion/heartburn, nausea/vomiting/abdominal pain, genital sores, muscle weakness, difficulty walking, abnormal bleeding, or enlarged lymph nodes.     Objective:   Physical Exam BP 139/83   Pulse 79   Ht 6' 4.5" (1.943 m)   Wt 227 lb 12.8 oz (103.3 kg)   BMI 27.37 kg/m  Gen:  Alert, cooperative patient who appears stated age in no acute distress.  Vital signs reviewed. Head:  [place]/AT Eyes: PERRL, EOMI Ears:  External ears WNL, Bilateral TM's normal without retraction, redness or bulging.  Canals clear BL  Mouth:  Good dental hygiene. Tonsils non-erythematous, non-edematous.   MMM Neck:  Trachea midline; supple Cardiac:  Regular rate and rhythm without murmur auscultated.   Pulm:  Clear to auscultation bilaterally with good air movement throuhout.  No wheezes or rales noted.   Abd:  Soft/nondistended/nontender.  Good bowel sounds throughout all four quadrants.  No masses noted.  Exts: No edema BL LE's, warm and well-perfused Neuro:  Alert and oriented to person, place, and date.  CN II-XII intact.  Sensation intact to light touch and vibration bilateral upper and lower extremities equally.  Motor function equal and strength 5/5 bilateral upper and lower extremities.  Normal gait.  DTRs +2 BL tricep, brachialis, patellar, and achilles.  Finger to nose cerebellar testing within normal limits.  Color vision testing normal. Psych:  Not depressed or anxious appearing.  Linear and coherent thought process as evidenced by speech pattern.  Smiles spontaneously.   No results found for this or any previous visit (from the past 72 hour(s)).

## 2016-06-13 NOTE — Assessment & Plan Note (Signed)
Spirometry evaluation without bronchodilator reveals near normal lung function.  FEV1 and FEV1/FVC ratio both exceed threshold for spirometric parameters. FVC within 0.2 L of goal.

## 2016-06-13 NOTE — Addendum Note (Signed)
Addended by: Leavy Cella on: 06/13/2016 03:31 PM   Modules accepted: Orders

## 2017-03-11 ENCOUNTER — Other Ambulatory Visit: Payer: 59

## 2017-03-17 NOTE — Progress Notes (Signed)
HPI:  Here for CPE: -had [place] CPE with Dr. Mingo Amber 05/2016 - but reports also does CPE here with me and this is ok with his insurance. Due for labs: lipids, ?PSA  -Concerns and/or follow up today:  PMH HTN (on acei in the past then was able to d/c with lifestyle changes), GERD, mild HLD.  -Diet: variety of foods, balance and well rounded  -Exercise:  regular exercise  -Diabetes and Dyslipidemia Screening: fasting  -Hx of HTN: no  -Vaccines: UTD  -wants STI testing, Hep C screening (if born 32-1963): no to STI screening, agrees to hep C screen  -FH colon or prstate ca: see FH Last colon cancer screening: UTD Last prostate ca screening: discussed options, risks, benefits; offered DRE and PSA - he declined  -Alcohol, Tobacco, drug use: see social history  Review of Systems - no fevers, unintentional weight loss, vision loss, hearing loss, chest pain, sob, hemoptysis, melena, hematochezia, hematuria, genital discharge, changing or concerning skin lesions, bleeding, bruising, loc, thoughts of self harm or SI  Past Medical History:  Diagnosis Date  . GERD (gastroesophageal reflux disease)   . Hypertension     Past Surgical History:  Procedure Laterality Date  . APPENDECTOMY    . NASAL SEPTUM SURGERY    . TONSILLECTOMY    . UVULECTOMY      Family History  Problem Relation Age of Onset  . Heart block Father   . Cervical cancer Mother 34    Social History   Social History  . Marital status: Married    Spouse name: N/A  . Number of children: N/A  . Years of education: N/A   Social History Main Topics  . Smoking status: Never Smoker  . Smokeless tobacco: Never Used  . Alcohol use 0.0 oz/week     Comment: glass of wine 4 nights per week  . Drug use: No  . Sexual activity: Not Asked   Other Topics Concern  . 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  Flaxseed, Linseed, (FLAX SEEDS PO), Take by mouth., Disp: , Rfl:  .  Multiple Vitamin (MULTIVITAMIN) capsule, Take 1 capsule by mouth daily., Disp: , Rfl:  .  omeprazole (PRILOSEC) 20 MG capsule, Take 20 mg by mouth every other day. Alternates with Zantac, Disp: , Rfl:  .  RaNITidine HCl (ZANTAC PO), Take by mouth every other day. Alternates with Prilosec, Disp: , Rfl:   EXAM:  Vitals:   03/19/17 0722  BP: 118/88  Pulse: 84  Temp: 98.5 F (36.9 C)  TempSrc: Oral  Weight: 228 lb 11.2 oz (103.7 kg)  Height: 6' 3.5" (1.918 m)    Estimated body mass index is 28.21 kg/m as calculated from the following:   Height as of this encounter: 6' 3.5" (1.918 m).   Weight as of this encounter: 228 lb 11.2 oz (103.7 kg).  GENERAL: vitals reviewed and listed below, alert, oriented, appears well hydrated and in no acute distress  HEENT: head atraumatic, PERRLA, normal appearance of eyes, ears, nose and mouth. moist mucus membranes.  NECK: supple, no masses or lymphadenopathy  LUNGS: clear to auscultation bilaterally, no rales, rhonchi  or wheeze  CV: HRRR, no peripheral edema or cyanosis, normal pedal pulses  ABDOMEN: bowel sounds normal, soft, non tender to palpation, no masses, no rebound or guarding  GU: normal appearance of external genitalia - no lesions or masses, hernia exam normal.  RECTAL: refused  SKIN: no rash or abnormal lesions  MS: normal gait, moves all extremities normally  NEURO: normal gait, speech and thought processing grossly intact, muscle tone grossly intact throughout  PSYCH: normal affect, pleasant and cooperative  ASSESSMENT AND PLAN:  Discussed the following assessment and plan:  Encounter for preventive health examination  Gastroesophageal reflux disease, esophagitis presence not  specified  Hyperlipidemia, unspecified hyperlipidemia type - Plan: Lipid panel  Encounter for HCV screening test for high risk patient - Plan: Hepatitis C antibody  -Discussed and advised all [place] preventive services health task force level A and B recommendations for age, sex and risks.  --Advised at least 150 minutes of exercise per week and a healthy diet with avoidance of (less then 1 serving per week) processed foods, white starches, red meat, fast foods and sweets and consisting of: * 5-9 servings of fresh fruits and vegetables (not corn or potatoes) *nuts and seeds, beans *olives and olive oil *lean meats such as fish and white chicken  *whole grains  -FASTING labs, studies and vaccines per orders this encounter   Patient advised to return to clinic immediately if symptoms worsen or persist or new concerns.  Patient Instructions  BEFORE YOU LEAVE: -recheck BP -labs -follow up: yearly and as needed  We have ordered labs or studies at this visit. It can take up to 1-2 weeks for results and processing. IF results require follow up or explanation, we will call you with instructions. Clinically stable results will be released to your [facility]. If you have not heard from [place] or cannot find your results in [facility] in 2 weeks please contact our office at [phone].  If you are not yet signed up for [facility], please consider signing up.   We recommend the following healthy lifestyle for LIFE: 1) Small portions. Regular healthy meals.  2) Eat a healthy clean diet.   TRY TO EAT: -at least 5-7 servings of low sugar vegetables per day (not corn, potatoes or bananas.) -berries are the best choice if you wish to eat fruit.   -lean meets (fish, chicken or [place] breasts) -vegan proteins for some meals - beans or tofu, whole grains, nuts and seeds -Replace bad fats with good fats - good fats include: fish, nuts and seeds, canola oil, olive oil -small amounts of low fat or non fat  dairy -small amounts of100 % whole grains - check the lables  AVOID: -SUGAR, sweets, anything with added sugar, corn syrup or sweeteners -if you must have a sweetener, small amounts of stevia may be best -sweetened beverages -simple starches (rice, bread, potatoes, pasta, chips, etc - small amounts of 100% whole grains are ok) -red meat, pork, butter -fried foods, fast food, processed food, excessive dairy, eggs and coconut.  3)Get at least 150 minutes of sweaty aerobic exercise per week.  4)Reduce stress - consider counseling, meditation and relaxation to balance other aspects of your life.    WE NOW OFFER   [facility] FAST TRACK!!!  SAME DAY Appointments for ACUTE CARE  Such as: Sprains, Injuries, cuts, abrasions, rashes, muscle pain, joint pain, back pain Colds, flu, sore throats, headache, allergies, cough, fever  Ear pain, sinus and eye infections Abdominal pain, nausea, vomiting, diarrhea,  upset stomach Animal/insect bites  3 Easy Ways to Schedule: Walk-In Scheduling Call in scheduling Mychart Sign-up: [email or web address]     Health Maintenance, Male A healthy lifestyle and preventive care is important for your health and wellness. Ask your health care provider about what schedule of regular examinations is right for you. What should I know about weight and diet? Eat a Healthy Diet  Eat plenty of vegetables, fruits, whole grains, low-fat dairy products, and lean protein.  Do not eat a lot of foods high in solid fats, added sugars, or salt.  Maintain a Healthy Weight Regular exercise can help you achieve or maintain a healthy weight. You should:  Do at least 150 minutes of exercise each week. The exercise should increase your heart rate and make you sweat (moderate-intensity exercise).  Do strength-training exercises at least twice a week.  Watch Your Levels of Cholesterol and Blood Lipids  Have your blood tested for lipids and  cholesterol every 5 years starting at 59 years of age. If you are at high risk for heart disease, you should start having your blood tested when you are 59 years old. You may need to have your cholesterol levels checked more often if: ? Your lipid or cholesterol levels are high. ? You are older than 59 years of age. ? You are at high risk for heart disease.  What should I know about cancer screening? Many types of cancers can be detected early and may often be prevented. Lung Cancer  You should be screened every year for lung cancer if: ? You are a current smoker who has smoked for at least 30 years. ? You are a former smoker who has quit within the past 15 years.  Talk to your health care provider about your screening options, when you should start screening, and how often you should be screened.  Colorectal Cancer  Routine colorectal cancer screening usually begins at 59 years of age and should be repeated every 5-10 years until you are 59 years old. You may need to be screened more often if early forms of precancerous polyps or small growths are found. Your health care provider may recommend screening at an earlier age if you have risk factors for colon cancer.  Your health care provider may recommend using home test kits to check for hidden blood in the stool.  A small camera at the end of a tube can be used to examine your colon (sigmoidoscopy or colonoscopy). This checks for the earliest forms of colorectal cancer.  Prostate and Testicular Cancer  Depending on your age and overall health, your health care provider may do certain tests to screen for prostate and testicular cancer.  Talk to your health care provider about any symptoms or concerns you have about testicular or prostate cancer.  Skin Cancer  Check your skin from head to toe regularly.  Tell your health care provider about any new moles or changes in moles, especially if: ? There is a change in a mole's size, shape,  or color. ? You have a mole that is larger than a pencil eraser.  Always use sunscreen. Apply sunscreen liberally and repeat throughout the day.  Protect yourself by wearing long sleeves, pants, a wide-brimmed hat, and sunglasses when outside.  What should I know about heart disease, diabetes, and high blood pressure?  If you are 44-45 years of age, have your blood pressure checked every 3-5 years. If you are 56 years of age or older, have your blood  pressure checked every year. You should have your blood pressure measured twice-once when you are at a hospital or clinic, and once when you are not at a hospital or clinic. Record the average of the two measurements. To check your blood pressure when you are not at a hospital or clinic, you can use: ? An automated blood pressure machine at a pharmacy. ? A home blood pressure monitor.  Talk to your health care provider about your target blood pressure.  If you are between 68-80 years old, ask your health care provider if you should take aspirin to prevent heart disease.  Have regular diabetes screenings by checking your fasting blood sugar level. ? If you are at a normal weight and have a low risk for diabetes, have this test once every three years after the age of 78. ? If you are overweight and have a high risk for diabetes, consider being tested at a younger age or more often.  A one-time screening for abdominal aortic aneurysm (AAA) by ultrasound is recommended for men aged 50-75 years who are current or former smokers. What should I know about preventing infection? Hepatitis B If you have a higher risk for hepatitis B, you should be screened for this virus. Talk with your health care provider to find out if you are at risk for hepatitis B infection. Hepatitis C Blood testing is recommended for:  Everyone born from [birth year] through 1963.  Anyone with known risk factors for hepatitis C.  Sexually Transmitted Diseases (STDs)  You should  be screened each year for STDs including gonorrhea and chlamydia if: ? You are sexually active and are younger than 59 years of age. ? You are older than 59 years of age and your health care provider tells you that you are at risk for this type of infection. ? Your sexual activity has changed since you were last screened and you are at an increased risk for chlamydia or gonorrhea. Ask your health care provider if you are at risk.  Talk with your health care provider about whether you are at high risk of being infected with HIV. Your health care provider may recommend a prescription medicine to help prevent HIV infection.  What else can I do?  Schedule regular health, dental, and eye exams.  Stay current with your vaccines (immunizations).  Do not use any tobacco products, such as cigarettes, chewing tobacco, and e-cigarettes. If you need help quitting, ask your health care provider.  Limit alcohol intake to no more than 2 drinks per day. One drink equals 12 ounces of beer, 5 ounces of wine, or 1 ounces of hard liquor.  Do not use street drugs.  Do not share needles.  Ask your health care provider for help if you need support or information about quitting drugs.  Tell your health care provider if you often feel depressed.  Tell your health care provider if you have ever been abused or do not feel safe at home. This information is not intended to replace advice given to you by your health care provider. Make sure you discuss any questions you have with your health care provider. Document Released: 02/07/2008 Document Revised: 04/09/2016 Document Reviewed: 05/15/2015 Elsevier Interactive Patient Education  2018 [employer].           No Follow-up on file.   Colin Benton R., DO

## 2017-03-19 ENCOUNTER — Ambulatory Visit (INDEPENDENT_AMBULATORY_CARE_PROVIDER_SITE_OTHER): Payer: 59 | Admitting: Family Medicine

## 2017-03-19 ENCOUNTER — Encounter: Payer: Self-pay | Admitting: Family Medicine

## 2017-03-19 VITALS — BP 118/88 | HR 84 | Temp 98.5°F | Ht 75.5 in | Wt 228.7 lb

## 2017-03-19 DIAGNOSIS — E785 Hyperlipidemia, unspecified: Secondary | ICD-10-CM

## 2017-03-19 DIAGNOSIS — Z0000 Encounter for general adult medical examination without abnormal findings: Secondary | ICD-10-CM

## 2017-03-19 DIAGNOSIS — Z9189 Other specified personal risk factors, not elsewhere classified: Secondary | ICD-10-CM

## 2017-03-19 DIAGNOSIS — Z1159 Encounter for screening for other viral diseases: Secondary | ICD-10-CM | POA: Diagnosis not present

## 2017-03-19 DIAGNOSIS — K219 Gastro-esophageal reflux disease without esophagitis: Secondary | ICD-10-CM

## 2017-03-19 LAB — LIPID PANEL
CHOL/HDL RATIO: 5
Cholesterol: 213 mg/dL — ABNORMAL HIGH (ref 0–200)
HDL: 42.8 mg/dL (ref >39.00)
LDL CALC: 142 mg/dL — AB (ref 0–99)
NONHDL: 170.41
Triglycerides: 140 mg/dL (ref 0.0–149.0)
VLDL: 28 mg/dL (ref 0.0–40.0)

## 2017-03-19 LAB — HEPATITIS C ANTIBODY: HCV Ab: NEGATIVE

## 2017-03-19 NOTE — Patient Instructions (Addendum)
BEFORE YOU LEAVE: -recheck BP -labs -follow up: yearly and as needed  We have ordered labs or studies at this visit. It can take up to 1-2 weeks for results and processing. IF results require follow up or explanation, we will call you with instructions. Clinically stable results will be released to your [facility]. If you have not heard from [place] or cannot find your results in [facility] in 2 weeks please contact our office at [phone].  If you are not yet signed up for [facility], please consider signing up.   We recommend the following healthy lifestyle for LIFE: 1) Small portions. Regular healthy meals.  2) Eat a healthy clean diet.   TRY TO EAT: -at least 5-7 servings of low sugar vegetables per day (not corn, potatoes or bananas.) -berries are the best choice if you wish to eat fruit.   -lean meets (fish, chicken or [place] breasts) -vegan proteins for some meals - beans or tofu, whole grains, nuts and seeds -Replace bad fats with good fats - good fats include: fish, nuts and seeds, canola oil, olive oil -small amounts of low fat or non fat dairy -small amounts of100 % whole grains - check the lables  AVOID: -SUGAR, sweets, anything with added sugar, corn syrup or sweeteners -if you must have a sweetener, small amounts of stevia may be best -sweetened beverages -simple starches (rice, bread, potatoes, pasta, chips, etc - small amounts of 100% whole grains are ok) -red meat, pork, butter -fried foods, fast food, processed food, excessive dairy, eggs and coconut.  3)Get at least 150 minutes of sweaty aerobic exercise per week.  4)Reduce stress - consider counseling, meditation and relaxation to balance other aspects of your life.    WE NOW OFFER   [facility] FAST TRACK!!!  SAME DAY Appointments for ACUTE CARE  Such as: Sprains, Injuries, cuts, abrasions, rashes, muscle pain, joint pain, back pain Colds, flu, sore throats, headache, allergies, cough, fever  Ear pain,  sinus and eye infections Abdominal pain, nausea, vomiting, diarrhea, upset stomach Animal/insect bites  3 Easy Ways to Schedule: Walk-In Scheduling Call in scheduling Mychart Sign-up: [email or web address]     Health Maintenance, Male A healthy lifestyle and preventive care is important for your health and wellness. Ask your health care provider about what schedule of regular examinations is right for you. What should I know about weight and diet? Eat a Healthy Diet  Eat plenty of vegetables, fruits, whole grains, low-fat dairy products, and lean protein.  Do not eat a lot of foods high in solid fats, added sugars, or salt.  Maintain a Healthy Weight Regular exercise can help you achieve or maintain a healthy weight. You should:  Do at least 150 minutes of exercise each week. The exercise should increase your heart rate and make you sweat (moderate-intensity exercise).  Do strength-training exercises at least twice a week.  Watch Your Levels of Cholesterol and Blood Lipids  Have your blood tested for lipids and cholesterol every 5 years starting at 59 years of age. If you are at high risk for heart disease, you should start having your blood tested when you are 59 years old. You may need to have your cholesterol levels checked more often if: ? Your lipid or cholesterol levels are high. ? You are older than 59 years of age. ? You are at high risk for heart disease.  What should I know about cancer screening? Many types of cancers can be detected early and may often be prevented.  Lung Cancer  You should be screened every year for lung cancer if: ? You are a current smoker who has smoked for at least 30 years. ? You are a former smoker who has quit within the past 15 years.  Talk to your health care provider about your screening options, when you should start screening, and how often you should be screened.  Colorectal Cancer  Routine colorectal cancer screening  usually begins at 59 years of age and should be repeated every 5-10 years until you are 59 years old. You may need to be screened more often if early forms of precancerous polyps or small growths are found. Your health care provider may recommend screening at an earlier age if you have risk factors for colon cancer.  Your health care provider may recommend using home test kits to check for hidden blood in the stool.  A small camera at the end of a tube can be used to examine your colon (sigmoidoscopy or colonoscopy). This checks for the earliest forms of colorectal cancer.  Prostate and Testicular Cancer  Depending on your age and overall health, your health care provider may do certain tests to screen for prostate and testicular cancer.  Talk to your health care provider about any symptoms or concerns you have about testicular or prostate cancer.  Skin Cancer  Check your skin from head to toe regularly.  Tell your health care provider about any new moles or changes in moles, especially if: ? There is a change in a mole's size, shape, or color. ? You have a mole that is larger than a pencil eraser.  Always use sunscreen. Apply sunscreen liberally and repeat throughout the day.  Protect yourself by wearing long sleeves, pants, a wide-brimmed hat, and sunglasses when outside.  What should I know about heart disease, diabetes, and high blood pressure?  If you are 85-63 years of age, have your blood pressure checked every 3-5 years. If you are 65 years of age or older, have your blood pressure checked every year. You should have your blood pressure measured twice-once when you are at a hospital or clinic, and once when you are not at a hospital or clinic. Record the average of the two measurements. To check your blood pressure when you are not at a hospital or clinic, you can use: ? An automated blood pressure machine at a pharmacy. ? A home blood pressure monitor.  Talk to your health care  provider about your target blood pressure.  If you are between 108-88 years old, ask your health care provider if you should take aspirin to prevent heart disease.  Have regular diabetes screenings by checking your fasting blood sugar level. ? If you are at a normal weight and have a low risk for diabetes, have this test once every three years after the age of 15. ? If you are overweight and have a high risk for diabetes, consider being tested at a younger age or more often.  A one-time screening for abdominal aortic aneurysm (AAA) by ultrasound is recommended for men aged 26-75 years who are current or former smokers. What should I know about preventing infection? Hepatitis B If you have a higher risk for hepatitis B, you should be screened for this virus. Talk with your health care provider to find out if you are at risk for hepatitis B infection. Hepatitis C Blood testing is recommended for:  Everyone born from [birth year] through 1963.  Anyone with known risk factors for  hepatitis C.  Sexually Transmitted Diseases (STDs)  You should be screened each year for STDs including gonorrhea and chlamydia if: ? You are sexually active and are younger than 59 years of age. ? You are older than 59 years of age and your health care provider tells you that you are at risk for this type of infection. ? Your sexual activity has changed since you were last screened and you are at an increased risk for chlamydia or gonorrhea. Ask your health care provider if you are at risk.  Talk with your health care provider about whether you are at high risk of being infected with HIV. Your health care provider may recommend a prescription medicine to help prevent HIV infection.  What else can I do?  Schedule regular health, dental, and eye exams.  Stay current with your vaccines (immunizations).  Do not use any tobacco products, such as cigarettes, chewing tobacco, and e-cigarettes. If you need help quitting, ask  your health care provider.  Limit alcohol intake to no more than 2 drinks per day. One drink equals 12 ounces of beer, 5 ounces of wine, or 1 ounces of hard liquor.  Do not use street drugs.  Do not share needles.  Ask your health care provider for help if you need support or information about quitting drugs.  Tell your health care provider if you often feel depressed.  Tell your health care provider if you have ever been abused or do not feel safe at home. This information is not intended to replace advice given to you by your health care provider. Make sure you discuss any questions you have with your health care provider. Document Released: 02/07/2008 Document Revised: 04/09/2016 Document Reviewed: 05/15/2015 Elsevier Interactive Patient Education  [employer].

## 2017-04-13 ENCOUNTER — Encounter: Payer: Self-pay | Admitting: Family Medicine

## 2017-04-13 ENCOUNTER — Ambulatory Visit (INDEPENDENT_AMBULATORY_CARE_PROVIDER_SITE_OTHER): Payer: 59 | Admitting: Family Medicine

## 2017-04-13 VITALS — BP 132/82 | HR 70 | Temp 98.4°F | Ht 75.5 in | Wt 235.4 lb

## 2017-04-13 DIAGNOSIS — H6982 Other specified disorders of Eustachian tube, left ear: Secondary | ICD-10-CM | POA: Diagnosis not present

## 2017-04-13 DIAGNOSIS — H9202 Otalgia, left ear: Secondary | ICD-10-CM | POA: Diagnosis not present

## 2017-04-13 MED ORDER — FLUTICASONE PROPIONATE 50 MCG/ACT NA SUSP: 2.0000 | Freq: Every day | NASAL | 0 refills | Status: DC

## 2017-04-13 MED ORDER — CIPROFLOXACIN-DEXAMETHASONE 0.3-0.1 % OT SUSP: 4.0000 [drp] | Freq: Two times a day (BID) | OTIC | 0 refills | Status: DC

## 2017-04-13 MED FILL — CIPRODEX OTIC SUSPENSION: 0.3-0.1 | 18 days supply | Qty: 8 | Fill #0

## 2017-04-13 MED FILL — FLUTICASONE PROP 50 MCG SPR: 50 | 30 days supply | Qty: 16 | Fill #0

## 2017-04-13 NOTE — Patient Instructions (Signed)
Use the drops in the ear twice daily for 5-7 days.  Afrin nasal spray twice daily for 3 days. Do not use longer then 3 days.  Flonase 2 sprays each nostril for 3 weeks.  Avoid diving until better.  Wear and ear plug if swimming - but avoid swimming for a few days.  I hope you are feeling better soon! Follow up if worsening, new concerns or you are not improving with treatment.

## 2017-04-13 NOTE — Progress Notes (Signed)
HPI:  Acute visit for L ear pain: -started: yesterday, hurt when swimming, especially when diving -symptoms:mild nasal congestion, sneeze -denies:fever, SOB, NVD, tooth pain, hearing loss, drainage from ear -has tried: nothing -sick contacts/travel/risks: no reported flu, strep or tick exposure  ROS: See pertinent positives and negatives per HPI.  Past Medical History:  Diagnosis Date  . GERD (gastroesophageal reflux disease)   . Hypertension     Past Surgical History:  Procedure Laterality Date  . APPENDECTOMY    . NASAL SEPTUM SURGERY    . TONSILLECTOMY    . UVULECTOMY      Family History  Problem Relation Age of Onset  . Heart block Father   . Cervical cancer Mother 35    Social History   Social History  . Marital status: Married    Spouse name: N/A  . Number of children: N/A  . Years of education: N/A   Social History Main Topics  . Smoking status: Never Smoker  . Smokeless tobacco: Never Used  . Alcohol use 0.0 oz/week     Comment: glass of wine 4 nights per week  . Drug use: No  . Sexual activity: Not Asked   Other Topics Concern  . 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  Flaxseed, Linseed, (FLAX SEEDS PO), Take by mouth., Disp: , Rfl:  .  Multiple Vitamin (MULTIVITAMIN) capsule, Take 1 capsule by mouth daily., Disp: , Rfl:  .  omeprazole (PRILOSEC) 20 MG capsule, Take 20 mg by mouth every other day. Alternates with Zantac, Disp: , Rfl:  .  RaNITidine HCl (ZANTAC PO), Take by mouth every other day. Alternates with Prilosec, Disp: , Rfl:  .  ciprofloxacin-dexamethasone (CIPRODEX) OTIC suspension, Place 4 drops into the left ear 2 (two) times daily. For 5-7 days, Disp: 7.5 mL, Rfl: 0 .   fluticasone (FLONASE) 50 MCG/ACT nasal spray, Place 2 sprays into both nostrils daily., Disp: 16 g, Rfl: 0  EXAM:  Vitals:   04/13/17 0749  BP: 132/82  Pulse: 70  Temp: 98.4 F (36.9 C)    Body mass index is 29.03 kg/m.  GENERAL: vitals reviewed and listed above, alert, oriented, appears well hydrated and in no acute distress  HEENT: atraumatic, conjunttiva clear, no obvious abnormalities on inspection of external nose and ears, normal appearance of ear canals and TMs, small piece non ubstructing wax L ear canal, minimal erythema canal without edema or purulent material, clear nasal congestion, mild post oropharyngeal erythema with PND, no tonsillar edema or exudate, no sinus TTP, no mastoid TTP  NECK: no obvious masses on inspection  LUNGS: clear to auscultation bilaterally, no wheezes, rales or rhonchi, good air movement  CV: HRRR, no peripheral edema  MS: moves all extremities without noticeable abnormality  PSYCH: pleasant and cooperative, no obvious depression or anxiety  ASSESSMENT AND PLAN:  Discussed the following assessment and plan:  Left ear pain  Dysfunction of left eustachian tube  -given HPI and exam findings today, a serious infection or illness is unlikely. We discussed potential etiologies, with possible mild OE, AR vs VURI being most likely with likely ETD. attempted to very gently remove  small piece of wax per his wishes with soft curette but his ear canal is very sensitive and opted to leave. We discussed treatment side effects, likely course, antibiotic misuse, transmission, and signs of developing a serious illness. Opted for short course nasal decongestant and longer course flonase for ETD and several days ear drops for L ear canal. No swimming or diving until resolved. -of course, we advised to return or notify a doctor immediately if symptoms worsen or persist or new concerns arise.    Patient Instructions  Use the drops in the ear twice daily for  5-7 days.  Afrin nasal spray twice daily for 3 days. Do not use longer then 3 days.  Flonase 2 sprays each nostril for 3 weeks.  Avoid diving until better.  Wear and ear plug if swimming - but avoid swimming for a few days.  I hope you are feeling better soon! Follow up if worsening, new concerns or you are not improving with treatment.     Colin Benton R., DO

## 2017-06-19 DIAGNOSIS — H43811 Vitreous degeneration, right eye: Secondary | ICD-10-CM | POA: Diagnosis not present

## 2017-07-17 DIAGNOSIS — H43811 Vitreous degeneration, right eye: Secondary | ICD-10-CM | POA: Diagnosis not present

## 2017-07-21 ENCOUNTER — Ambulatory Visit (INDEPENDENT_AMBULATORY_CARE_PROVIDER_SITE_OTHER): Payer: 59 | Admitting: Family Medicine

## 2017-07-21 ENCOUNTER — Encounter: Payer: Self-pay | Admitting: Family Medicine

## 2017-07-21 VITALS — BP 122/82 | HR 75 | Temp 98.4°F | Ht 75.5 in | Wt 234.4 lb

## 2017-07-21 DIAGNOSIS — J989 Respiratory disorder, unspecified: Secondary | ICD-10-CM | POA: Diagnosis not present

## 2017-07-21 MED ORDER — BENZONATATE 100 MG PO CAPS: 100.0000 mg | ORAL_CAPSULE | Freq: Two times a day (BID) | ORAL | 0 refills | Status: DC | PRN

## 2017-07-21 MED FILL — BENZONATATE 100 MG CAPS: 100 | 10 days supply | Qty: 20 | Fill #0

## 2017-07-21 NOTE — Progress Notes (Signed)
HPI:  Acute visit for respiratory illness: -started:3-5 days ago -symptoms:nasal congestion, sore throat, cough, occ ? 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  Past Medical History:  Diagnosis Date  . GERD (gastroesophageal reflux disease)   . Hypertension     Past Surgical History:  Procedure Laterality Date  . APPENDECTOMY    . NASAL SEPTUM SURGERY    . TONSILLECTOMY    . UVULECTOMY      Family History  Problem Relation Age of Onset  . Heart block Father   . Cervical cancer Mother 5    Social History   Socioeconomic History  . Marital status: Married    Spouse name: None  . Number of children: None  . Years of education: None  . Highest education level: None  Social Needs  . Financial resource strain: None  . Food insecurity - worry: None  . Food insecurity - inability: None  . Transportation needs - medical: None  . Transportation needs - non-medical: None  Occupational History  . None  Tobacco Use  . Smoking status: Never Smoker  . Smokeless tobacco: Never Used  Substance and Sexual Activity  . Alcohol use: Yes    Alcohol/week: 0.0 oz    Comment: glass of wine 4 nights per week  . Drug use: No  . Sexual activity: None  Other Topics Concern  . 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  Flaxseed, Linseed, (FLAX SEEDS PO), Take by mouth., Disp: , Rfl:  .  Multiple Vitamin (MULTIVITAMIN) capsule, Take 1 capsule by mouth daily., Disp: , Rfl:  .  omeprazole (PRILOSEC) 20 MG  capsule, Take 20 mg by mouth every other day. Alternates with Zantac, Disp: , Rfl:  .  RaNITidine HCl (ZANTAC PO), Take by mouth every other day. Alternates with Prilosec, Disp: , Rfl:  .  benzonatate (TESSALON) 100 MG capsule, Take 1 capsule (100 mg total) by mouth 2 (two) times daily as needed for cough., Disp: 20 capsule, Rfl: 0  EXAM:  Vitals:   07/21/17 1538  BP: 122/82  Pulse: 75  Temp: 98.4 F (36.9 C)  SpO2: 96%    Body mass index is 28.91 kg/m.  GENERAL: vitals reviewed and listed above, alert, oriented, appears well hydrated and in no acute distress  HEENT: atraumatic, conjunttiva clear, no obvious abnormalities on inspection of external nose and ears, normal appearance of ear canals and TMs, clear nasal congestion, mild post oropharyngeal erythema with PND, no tonsillar edema or exudate, no sinus TTP  NECK: no obvious masses on inspection  LUNGS: clear to auscultation bilaterally, no wheezes, rales or rhonchi, good air movement  CV: HRRR, no peripheral edema  MS: moves all extremities without noticeable abnormality  PSYCH: pleasant and cooperative, no obvious depression or anxiety  ASSESSMENT AND PLAN:  Discussed the following assessment and plan:  Respiratory illness  -given HPI and exam findings today, a serious infection or illness is unlikely. We discussed potential etiologies, with VURI  being most likely, and advised supportive care and monitoring. We discussed treatment side effects, likely course, antibiotic misuse, transmission, and signs of developing a serious illness. 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    Patient Instructions  INSTRUCTIONS FOR  UPPER RESPIRATORY INFECTION:  -plenty of rest and fluids  -nasal saline wash 2-3 times daily (use prepackaged nasal saline or bottled/distilled water if making your own)   -can use AFRIN nasal spray for drainage and nasal congestion - but do NOT use longer then 3-4 days  -can use tylenol (in no history of liver disease) or ibuprofen (if no history of kidney disease, bowel bleeding or significant heart disease) as directed for aches and sorethroat  -in the winter time, using a humidifier at night is helpful (please follow cleaning instructions)  -if you are taking a cough medication - use only as directed, may also try a teaspoon of honey to coat the throat and throat lozenges. If given a cough medication with codeine or hydrocodone or other narcotic please be advised that this contains a strong and  potentially addicting medication.   -for sore throat, salt water gargles can help  -follow up if you have fevers, facial pain, tooth pain, difficulty breathing or are worsening or symptoms persist longer then expected  Upper Respiratory Infection, Adult An upper respiratory infection (URI) is also known as the common cold. It is often caused by a type of germ (virus). Colds are easily spread (contagious). You can pass it to others by kissing, coughing, sneezing, or drinking out of the same glass. Usually, you get better in 1 to 3  weeks.  However, the cough can last for even longer. HOME CARE   Only take medicine as told by your doctor. Follow instructions provided above.  Drink enough water and fluids to keep your pee (urine) clear or pale yellow.  Get plenty of rest.  Return to work when your temperature is < 100 for 24 hours or as told by your doctor. You may use a face mask and wash your hands to stop your cold from spreading. GET HELP RIGHT AWAY IF:   After the first few days, you feel you are getting worse.  You have questions about your medicine.  You have chills, shortness of  breath, or red spit (mucus).  You have pain in the face for more then 1-2 days, especially when you bend forward.  You have a fever, puffy (swollen) neck, pain when you swallow, or white spots in the back of your throat.  You have a bad headache, ear pain, sinus pain, or chest pain.  You have a high-pitched whistling sound when you breathe in and out (wheezing).  You cough up blood.  You have sore muscles or a stiff neck. MAKE SURE YOU:   Understand these instructions.  Will watch your condition.  Will get help right away if you are not doing well or get worse. Document Released: 01/28/2008 Document Revised: 11/03/2011 Document Reviewed: 11/16/2013 [facility] Patient Information 2015 [place], [place]. This information is not intended to replace advice given to you by your health care provider. Make sure you discuss any questions you have with your health care provider.  Colin Benton R., DO

## 2017-07-21 NOTE — Patient Instructions (Signed)
INSTRUCTIONS FOR UPPER RESPIRATORY INFECTION:  -plenty of rest and fluids  -nasal saline wash 2-3 times daily (use prepackaged nasal saline or bottled/distilled water if making your own)   -can use AFRIN nasal spray for drainage and nasal congestion - but do NOT use longer then 3-4 days  -can use tylenol (in no history of liver disease) or ibuprofen (if no history of kidney disease, bowel bleeding or significant heart disease) as directed for aches and sorethroat  -in the winter time, using a humidifier at night is helpful (please follow cleaning instructions)  -if you are taking a cough medication - use only as directed, may also try a teaspoon of honey to coat the throat and throat lozenges. If given a cough medication with codeine or hydrocodone or other narcotic please be advised that this contains a strong and  potentially addicting medication.   -for sore throat, salt water gargles can help  -follow up if you have fevers, facial pain, tooth pain, difficulty breathing or are worsening or symptoms persist longer then expected  Upper Respiratory Infection, Adult An upper respiratory infection (URI) is also known as the common cold. It is often caused by a type of germ (virus). Colds are easily spread (contagious). You can pass it to others by kissing, coughing, sneezing, or drinking out of the same glass. Usually, you get better in 1 to 3  weeks.  However, the cough can last for even longer. HOME CARE   Only take medicine as told by your doctor. Follow instructions provided above.  Drink enough water and fluids to keep your pee (urine) clear or pale yellow.  Get plenty of rest.  Return to work when your temperature is < 100 for 24 hours or as told by your doctor. You may use a face mask and wash your hands to stop your cold from spreading. GET HELP RIGHT AWAY IF:   After the first few days, you feel you are getting worse.  You have questions about your medicine.  You have  chills, shortness of breath, or red spit (mucus).  You have pain in the face for more then 1-2 days, especially when you bend forward.  You have a fever, puffy (swollen) neck, pain when you swallow, or white spots in the back of your throat.  You have a bad headache, ear pain, sinus pain, or chest pain.  You have a high-pitched whistling sound when you breathe in and out (wheezing).  You cough up blood.  You have sore muscles or a stiff neck. MAKE SURE YOU:   Understand these instructions.  Will watch your condition.  Will get help right away if you are not doing well or get worse. Document Released: 01/28/2008 Document Revised: 11/03/2011 Document Reviewed: 11/16/2013 [facility] Patient Information 2015 [place], [place]. This information is not intended to replace advice given to you by your health care provider. Make sure you discuss any questions you have with your health care provider.

## 2017-08-31 DIAGNOSIS — H903 Sensorineural hearing loss, bilateral: Secondary | ICD-10-CM | POA: Diagnosis not present

## 2018-03-16 DIAGNOSIS — Z008 Encounter for other general examination: Secondary | ICD-10-CM | POA: Diagnosis not present

## 2018-03-18 DIAGNOSIS — Z008 Encounter for other general examination: Secondary | ICD-10-CM | POA: Diagnosis not present

## 2018-03-21 NOTE — Progress Notes (Signed)
HPI:  Using dictation device. Unfortunately this device frequently misinterprets words/phrases.  Here for CPE:   -Concerns and/or follow up today:  Lawrence Wang is a pleasant 62 yo with a PMH HTN, GERD and HLD. He was on an acei in the past for the blood pressure then was able to discontinue it after lifestyle changes. Continues to manage HTN/HLD with regular exercise and healthy diet. Continues his ppi low dose and zantac for the GERD. Has tried various dietary changes and a trial off these medications recently - but had terrible heartburn/reflux symptoms so he is back on daily medical therapy. He requests rx for viagra. Reports used in the past for erectile dysfunction and worked will without side effects at a lower dose. -Diet: variety of foods -Exercise: regular exercise -Diabetes and Dyslipidemia Screening: -Hx of HTN: no -Vaccines: UTD -sexual activity: yes, male partner, no new partners -wants STI testing, Hep C screening (if born 04-1962): no -FH colon or prstate ca: see FH Last colon cancer screening: reports colonoscopy done in [place] done at age 6 and normal  Last prostate ca screening: n/a discussed risks benefits of prostate cancer screening -Alcohol, Tobacco, drug use: see social history  Review of Systems - no fevers, unintentional weight loss, vision loss, hearing loss, chest pain, sob, hemoptysis, melena, hematochezia, hematuria, genital discharge, changing or concerning skin lesions, bleeding, bruising, loc, thoughts of self harm or SI  Past Medical History:  Diagnosis Date  . GERD (gastroesophageal reflux disease)   . Hypertension     Past Surgical History:  Procedure Laterality Date  . APPENDECTOMY    . NASAL SEPTUM SURGERY    . TONSILLECTOMY    . UVULECTOMY      Family History  Problem Relation Age of Onset  . Heart block Father   . Cervical cancer Mother 89    Social History   Socioeconomic History  . Marital status: Married     Spouse name: Not on file  . Number of children: Not on file  . Years of education: Not on file  . Highest education level: Not on file  Occupational History  . Not on file  Social Needs  . Financial resource strain: Not on file  . Food insecurity:    Worry: Not on file    Inability: Not on file  . Transportation needs:    Medical: Not on file    Non-medical: Not on file  Tobacco Use  . Smoking status: Never Smoker  . Smokeless tobacco: Never Used  Substance and Sexual Activity  . Alcohol use: Yes    Alcohol/week: 0.0 oz    Comment: glass of wine 4 nights per week  . Drug use: No  . Sexual activity: Not on file  Lifestyle  . Physical activity:    Days per week: Not on file    Minutes per session: Not on file  . Stress: Not on file  Relationships  . Social connections:    Talks on phone: Not on file    Gets together: Not on file    Attends religious service: Not on file    Active member of club or organization: Not on file    Attends meetings of clubs or organizations: Not on file    Relationship status: Not on file  Other Topics Concern  . 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  Flaxseed, Linseed, (FLAX SEEDS PO), Take by mouth., Disp: , Rfl:  .  Multiple Vitamin (MULTIVITAMIN) capsule, Take 1 capsule by mouth daily., Disp: , Rfl:  .  omeprazole (PRILOSEC) 20 MG capsule, Take 20 mg by mouth every other day. Alternates with Zantac, Disp: , Rfl:  .  RaNITidine HCl (ZANTAC PO), Take by mouth every other day. Alternates with Prilosec, Disp: , Rfl:   EXAM:  Vitals:   03/23/18 0735  BP: 118/80  Pulse: 76  Temp: 98.4 F (36.9 C)  TempSrc: Oral  Weight: 226 lb 8 oz (102.7 kg)  Height: 6' 3.5" (1.918 m)     Estimated body mass index is 27.94 kg/m as calculated from the following:   Height as of this encounter: 6' 3.5" (1.918 m).   Weight as of this encounter: 226 lb 8 oz (102.7 kg).  GENERAL: vitals reviewed and listed below, alert, oriented, appears well hydrated and in no acute distress  HEENT: head atraumatic, PERRLA, normal appearance of eyes, ears, nose and mouth. moist mucus membranes.  NECK: supple, no masses or lymphadenopathy  LUNGS: clear to auscultation bilaterally, no rales, rhonchi or wheeze  CV: HRRR, no peripheral edema or cyanosis, normal pedal pulses  ABDOMEN: bowel sounds normal, soft, non tender to palpation, no masses, no rebound or guarding  GU: normal appearance of external genitalia - no lesions or masses, hernia exam normal.   RECTAL: deferred  SKIN: no rash or abnormal lesions on exposed portions of skin  MS: normal gait, moves all extremities normally  NEURO: normal gait, speech and thought processing grossly intact, muscle tone grossly intact throughout  PSYCH: normal affect, pleasant and cooperative  ASSESSMENT AND PLAN:  Discussed the following assessment and plan:  PREVENTIVE EXAM: -Discussed and advised all [place] preventive services health task force level A and B recommendations for age, sex and risks. -Advised at least 150 minutes of exercise per week and a healthy -discussed risks/benefit prostate ca screening and he opted for PSA test -labs, studies and vaccines per orders this encounter  Erectile dysfunction: -discussed risks/benefits/interactions/contraindications for viagra -refill provided  GERD: -continue PPI, zantac -discussed dietary/lifestyle changes for this  HTN/HLD: -labs per orders, continue healthy lifestyle and monitor, DBP up a little on arrival    There are no Patient Instructions on file for this visit.  No follow-ups on file.   Lucretia Kern, DO

## 2018-03-23 ENCOUNTER — Ambulatory Visit (INDEPENDENT_AMBULATORY_CARE_PROVIDER_SITE_OTHER): Payer: 59 | Admitting: Family Medicine

## 2018-03-23 ENCOUNTER — Encounter: Payer: Self-pay | Admitting: Family Medicine

## 2018-03-23 VITALS — BP 118/80 | HR 76 | Temp 98.4°F | Ht 75.5 in | Wt 226.5 lb

## 2018-03-23 DIAGNOSIS — K219 Gastro-esophageal reflux disease without esophagitis: Secondary | ICD-10-CM

## 2018-03-23 DIAGNOSIS — Z0000 Encounter for general adult medical examination without abnormal findings: Secondary | ICD-10-CM

## 2018-03-23 DIAGNOSIS — Z1331 Encounter for screening for depression: Secondary | ICD-10-CM

## 2018-03-23 LAB — HEMOGLOBIN A1C: HEMOGLOBIN A1C: 5.5 % (ref 4.6–6.5)

## 2018-03-23 LAB — LIPID PANEL
CHOL/HDL RATIO: 4
Cholesterol: 194 mg/dL (ref 0–200)
HDL: 47.1 mg/dL (ref >39.00)
LDL Cholesterol: 125 mg/dL — ABNORMAL HIGH (ref 0–99)
NONHDL: 147.12
Triglycerides: 113 mg/dL (ref 0.0–149.0)
VLDL: 22.6 mg/dL (ref 0.0–40.0)

## 2018-03-23 LAB — PSA: PSA: 1.08 ng/mL (ref 0.10–4.00)

## 2018-03-23 MED ORDER — SILDENAFIL CITRATE 25 MG PO TABS: ORAL_TABLET | ORAL | 3 refills | Status: DC

## 2018-03-23 NOTE — Patient Instructions (Signed)
BEFORE YOU LEAVE: -labs -follow up: yearly for CPE  We have ordered labs or studies at this visit. It can take up to 1-2 weeks for results and processing. IF results require follow up or explanation, we will call you with instructions. Clinically stable results will be released to your [facility]. If you have not heard from [place] or cannot find your results in [facility] in 2 weeks please contact our office at [phone].  If you are not yet signed up for [facility], please consider signing up.   Preventive Care 40-64 Years, Male Preventive care refers to lifestyle choices and visits with your health care provider that can promote health and wellness. What does preventive care include?  A yearly physical exam. This is also called an annual well check.  Dental exams once or twice a year.  Routine eye exams. Ask your health care provider how often you should have your eyes checked.  Personal lifestyle choices, including: ? Daily care of your teeth and gums. ? Regular physical activity. ? Eating a healthy diet. ? Avoiding tobacco and drug use. ? Limiting alcohol use. ? Practicing safe sex. What happens during an annual well check? The services and screenings done by your health care provider during your annual well check will depend on your age, overall health, lifestyle risk factors, and family history of disease. Counseling Your health care provider may ask you questions about your:  Alcohol use.  Tobacco use.  Drug use.  Emotional well-being.  Home and relationship well-being.  Sexual activity.  Eating habits.  Work and work Statistician.  Screening You may have the following tests or measurements:  Height, weight, and BMI.  Blood pressure.  Lipid and cholesterol levels. These may be checked every 5 years, or more frequently if you are over 28 years old.  Skin check.  Lung cancer screening. You may have this screening every year starting at age 38 if you have a  30-pack-year history of smoking and currently smoke or have quit within the past 15 years.  Fecal occult blood test (FOBT) of the stool. You may have this test every year starting at age 51.  Flexible sigmoidoscopy or colonoscopy. You may have a sigmoidoscopy every 5 years or a colonoscopy every 10 years starting at age 19.  Prostate cancer screening. Recommendations will vary depending on your family history and other risks.  Hepatitis C blood test.  Hepatitis B blood test.  Sexually transmitted disease (STD) testing.  Diabetes screening. This is done by checking your blood sugar (glucose) after you have not eaten for a while (fasting). You may have this done every 1-3 years.  Discuss your test results, treatment options, and if necessary, the need for more tests with your health care provider. Vaccines Your health care provider may recommend certain vaccines, such as:  Influenza vaccine. This is recommended every year.  Tetanus, diphtheria, and acellular pertussis (Tdap, Td) vaccine. You may need a Td booster every 10 years.  Varicella vaccine. You may need this if you have not been vaccinated.  Zoster vaccine. You may need this after age 42.  Measles, mumps, and rubella (MMR) vaccine. You may need at least one dose of MMR if you were born in 1957 or later. You may also need a second dose.  Pneumococcal 13-valent conjugate (PCV13) vaccine. You may need this if you have certain conditions and have not been vaccinated.  Pneumococcal polysaccharide (PPSV23) vaccine. You may need one or two doses if you smoke cigarettes or if  you have certain conditions.  Meningococcal vaccine. You may need this if you have certain conditions.  Hepatitis A vaccine. You may need this if you have certain conditions or if you travel or work in places where you may be exposed to hepatitis A.  Hepatitis B vaccine. You may need this if you have certain conditions or if you travel or work in places where  you may be exposed to hepatitis B.  Haemophilus influenzae type b (Hib) vaccine. You may need this if you have certain risk factors.  Talk to your health care provider about which screenings and vaccines you need and how often you need them. This information is not intended to replace advice given to you by your health care provider. Make sure you discuss any questions you have with your health care provider. Document Released: 09/07/2015 Document Revised: 04/30/2016 Document Reviewed: 06/12/2015 Elsevier Interactive Patient Education  [employer].

## 2018-04-09 MED FILL — SILDENAFIL CITRATE 25 MG TA: 25 | 30 days supply | Qty: 6 | Fill #0

## 2018-07-05 DIAGNOSIS — H903 Sensorineural hearing loss, bilateral: Secondary | ICD-10-CM | POA: Diagnosis not present

## 2018-08-30 MED FILL — SILDENAFIL CITRATE 25 MG TA: 25 | 30 days supply | Qty: 6 | Fill #1

## 2018-09-03 NOTE — Progress Notes (Deleted)
  HPI:  Using dictation device. Unfortunately this device frequently misinterprets words/phrases.  ***  ROS: See pertinent positives and negatives per HPI.  Past Medical History:  Diagnosis Date  . GERD (gastroesophageal reflux disease)   . Hypertension     Past Surgical History:  Procedure Laterality Date  . APPENDECTOMY    . NASAL SEPTUM SURGERY    . TONSILLECTOMY    . UVULECTOMY      Family History  Problem Relation Age of Onset  . Heart block Father   . Cervical cancer Mother 56    SOCIAL HX: ***   Current Outpatient Medications:  .  Flaxseed, Linseed, (FLAX SEEDS PO), Take by mouth., Disp: , Rfl:  .  Multiple Vitamin (MULTIVITAMIN) capsule, Take 1 capsule by mouth daily., Disp: , Rfl:  .  omeprazole (PRILOSEC) 20 MG capsule, Take 20 mg by mouth every other day. Alternates with Zantac, Disp: , Rfl:  .  RaNITidine HCl (ZANTAC PO), Take by mouth every other day. Alternates with Prilosec, Disp: , Rfl:  .  sildenafil (VIAGRA) 25 MG tablet, 1-3 tablets as needed prior to sexual intercourse. Do not use more then 1 dose in 24 hours, Disp: 10 tablet, Rfl: 3  EXAM:  There were no vitals filed for this visit.  There is no height or weight on file to calculate BMI.  GENERAL: vitals reviewed and listed above, alert, oriented, appears well hydrated and in no acute distress  HEENT: atraumatic, conjunttiva clear, no obvious abnormalities on inspection of external nose and ears  NECK: no obvious masses on inspection  LUNGS: clear to auscultation bilaterally, no wheezes, rales or rhonchi, good air movement  CV: HRRR, no peripheral edema  MS: moves all extremities without noticeable abnormality *** PSYCH: pleasant and cooperative, no obvious depression or anxiety  ASSESSMENT AND PLAN:  Discussed the following assessment and plan:  No diagnosis found.  *** -Patient advised to return or notify a doctor immediately if symptoms worsen or persist or new concerns  arise.  There are no Patient Instructions on file for this visit.  Lucretia Kern, DO

## 2018-09-06 ENCOUNTER — Ambulatory Visit: Payer: 59 | Admitting: Family Medicine

## 2018-09-09 ENCOUNTER — Encounter: Payer: Self-pay | Admitting: Family Medicine

## 2018-09-09 ENCOUNTER — Ambulatory Visit (INDEPENDENT_AMBULATORY_CARE_PROVIDER_SITE_OTHER): Payer: No Typology Code available for payment source | Admitting: Family Medicine

## 2018-09-09 VITALS — BP 130/88 | HR 71 | Temp 98.4°F | Ht 75.5 in | Wt 231.3 lb

## 2018-09-09 DIAGNOSIS — L989 Disorder of the skin and subcutaneous tissue, unspecified: Secondary | ICD-10-CM | POA: Diagnosis not present

## 2018-09-09 DIAGNOSIS — I10 Essential (primary) hypertension: Secondary | ICD-10-CM | POA: Diagnosis not present

## 2018-09-09 DIAGNOSIS — E663 Overweight: Secondary | ICD-10-CM

## 2018-09-09 NOTE — Patient Instructions (Signed)
BEFORE YOU LEAVE: -follow up: 3 months  -We placed a referral for you as discussed to the Dermatologist. It usually takes about 1 week to process and schedule this referral. If you have not heard from [place] regarding this appointment in 1 week please contact our office.   We recommend the following healthy lifestyle for LIFE: 1) Small portions. But, make sure to get regular (at least 3 per day), healthy meals and small healthy snacks if needed.  2) Eat a healthy clean diet.   TRY TO EAT: -at least 5-7 servings of low sugar, colorful, and nutrient rich vegetables per day (not corn, potatoes or bananas.) -berries are the best choice if you wish to eat fruit (only eat small amounts if trying to reduce weight)  -lean meets (fish, white meat of chicken or [place]) -vegan proteins for some meals - beans or tofu, whole grains, nuts and seeds -Replace bad fats with good fats - good fats include: fish, nuts and seeds, canola oil, olive oil -small amounts of low fat or non fat dairy -small amounts of100 % whole grains - check the lables -drink plenty of water  AVOID: -SUGAR, sweets, anything with added sugar, corn syrup or sweeteners - must read labels as even foods advertised as "healthy" often are loaded with sugar -if you must have a sweetener, small amounts of stevia may be best -sweetened beverages and artificially sweetened beverages -simple starches (rice, bread, potatoes, pasta, chips, etc - small amounts of 100% whole grains are ok) -red meat, pork, butter -fried foods, fast food, processed food, excessive dairy, eggs and coconut.  3)Get at least 150 minutes of sweaty aerobic exercise per week.  4)Reduce stress - consider counseling, meditation and relaxation to balance other aspects of your life.

## 2018-09-09 NOTE — Progress Notes (Signed)
HPI:  Using dictation device. Unfortunately this device frequently misinterprets words/phrases.  Acute visit for:  Skin lesion: -wife noticed about 3 weeks ago -no pain or pruitis -no hx skin condition   Elevated BP/Overweight: -admits lifestyle has not been good the last 6 months with not as much exercise and diet not as good -on lisinopril in the past but " really do not want to take medication" -asymptomatic -has gotten a trainer and is   ROS: See pertinent positives and negatives per HPI.  Past Medical History:  Diagnosis Date  . GERD (gastroesophageal reflux disease)   . Hypertension     Past Surgical History:  Procedure Laterality Date  . APPENDECTOMY    . NASAL SEPTUM SURGERY    . TONSILLECTOMY    . UVULECTOMY      Family History  Problem Relation Age of Onset  . Heart block Father   . Cervical cancer Mother 73    SOCIAL HX: see hpi   Current Outpatient Medications:  .  Flaxseed, Linseed, (FLAX SEEDS PO), Take by mouth., Disp: , Rfl:  .  Multiple Vitamin (MULTIVITAMIN) capsule, Take 1 capsule by mouth daily., Disp: , Rfl:  .  omeprazole (PRILOSEC) 20 MG capsule, Take 20 mg by mouth every other day. Alternates with Zantac, Disp: , Rfl:  .  RaNITidine HCl (ZANTAC PO), Take by mouth every other day. Alternates with Prilosec, Disp: , Rfl:  .  sildenafil (VIAGRA) 25 MG tablet, 1-3 tablets as needed prior to sexual intercourse. Do not use more then 1 dose in 24 hours, Disp: 10 tablet, Rfl: 3  EXAM:  Vitals:   09/09/18 1134  BP: 130/88  Pulse: 71  Temp: 98.4 F (36.9 C)    Body mass index is 28.53 kg/m.  GENERAL: vitals reviewed and listed above, alert, oriented, appears well hydrated and in no acute distress  HEENT: atraumatic, conjunttiva clear, no obvious abnormalities on inspection of external nose and ears  NECK: no obvious masses on inspection  LUNGS: clear to auscultation bilaterally, no wheezes, rales or rhonchi, good air movement  CV:  HRRR, no peripheral edema  MS: moves all extremities without noticeable abnormality  SKIN: 15x13mm scaly plaque with erythematous base R mid back  PSYCH: pleasant and cooperative, no obvious depression or anxiety  ASSESSMENT AND PLAN:  Discussed the following assessment and plan:  Skin lesion of back - Plan: Ambulatory referral to Dermatology -we discussed possible serious and likely etiologies, workup and treatment, treatment risks and return precautions -after this discussion, [place] opted for Dermatology evaluation - referral placed. Trial topical antifungal in interim. Pt advised to call if does not have appt details in 1 week.  Essential hypertension -he prefers to try to treat with lifestyle changes, plans to work on healthy diet and regular aerobic exercise -recheck 3 months  Overweight (BMI 25.0-29.9) -see above - lifestyle changes  Follow up as needed otherwise in the interim.  Patient Instructions  BEFORE YOU LEAVE: -follow up: 3 months  -We placed a referral for you as discussed to the Dermatologist. It usually takes about 1 week to process and schedule this referral. If you have not heard from [place] regarding this appointment in 1 week please contact our office.   We recommend the following healthy lifestyle for LIFE: 1) Small portions. But, make sure to get regular (at least 3 per day), healthy meals and small healthy snacks if needed.  2) Eat a healthy clean diet.   TRY TO EAT: -at least 5-7  servings of low sugar, colorful, and nutrient rich vegetables per day (not corn, potatoes or bananas.) -berries are the best choice if you wish to eat fruit (only eat small amounts if trying to reduce weight)  -lean meets (fish, white meat of chicken or [place]) -vegan proteins for some meals - beans or tofu, whole grains, nuts and seeds -Replace bad fats with good fats - good fats include: fish, nuts and seeds, canola oil, olive oil -small amounts of low fat or non fat  dairy -small amounts of100 % whole grains - check the lables -drink plenty of water  AVOID: -SUGAR, sweets, anything with added sugar, corn syrup or sweeteners - must read labels as even foods advertised as "healthy" often are loaded with sugar -if you must have a sweetener, small amounts of stevia may be best -sweetened beverages and artificially sweetened beverages -simple starches (rice, bread, potatoes, pasta, chips, etc - small amounts of 100% whole grains are ok) -red meat, pork, butter -fried foods, fast food, processed food, excessive dairy, eggs and coconut.  3)Get at least 150 minutes of sweaty aerobic exercise per week.  4)Reduce stress - consider counseling, meditation and relaxation to balance other aspects of your life.    Lucretia Kern, DO

## 2018-09-30 ENCOUNTER — Telehealth: Payer: Self-pay

## 2018-09-30 NOTE — Telephone Encounter (Signed)
Copied from [place] [phone]. Topic: Referral - Request for Referral >> Sep 30, 2018 10:36 AM Alanda Slim E wrote: Has patient seen PCP for this complaint? Yes  *If NO, is insurance requiring patient see PCP for this issue before PCP can refer them? Referral for which specialty: Dermatologist  Preferred provider/office: [facility]  Reason for referral: Pt was referred to [facility] but is already a Pt and has been seen at [facility] dermatology/ Pt wants to know if there was a reason to be referred to [place], if not he'd like to go to [facility] dermatology

## 2018-09-30 NOTE — Telephone Encounter (Signed)
Faxed referral and ov notes via release RRelease:  [id number]- referral on proficient  [facility] in [place], [place] Address: [address], [place], [place] [address] Phone: [phone]

## 2018-11-26 ENCOUNTER — Telehealth: Payer: Self-pay | Admitting: *Deleted

## 2018-11-26 NOTE — Telephone Encounter (Signed)
Copied from [place] Wang [phone]. Topic: General - Other >> Nov 24, 2018  3:01 PM [name], [place] C wrote: Reason for CRM: pt has an upcoming ov on 12/09/18. Pt would like to know if apt is needed? If so, pt would like to have it completed via webex.

## 2018-11-26 NOTE — Telephone Encounter (Signed)
I left a detailed message at the pts cell number an email was sent with information to have a Webex for the visit on 4/16.

## 2018-12-09 ENCOUNTER — Encounter: Payer: Self-pay | Admitting: Family Medicine

## 2018-12-09 ENCOUNTER — Ambulatory Visit (INDEPENDENT_AMBULATORY_CARE_PROVIDER_SITE_OTHER): Payer: No Typology Code available for payment source | Admitting: Family Medicine

## 2018-12-09 ENCOUNTER — Other Ambulatory Visit: Payer: Self-pay

## 2018-12-09 VITALS — BP 149/90

## 2018-12-09 DIAGNOSIS — E663 Overweight: Secondary | ICD-10-CM

## 2018-12-09 DIAGNOSIS — K219 Gastro-esophageal reflux disease without esophagitis: Secondary | ICD-10-CM | POA: Diagnosis not present

## 2018-12-09 DIAGNOSIS — I10 Essential (primary) hypertension: Secondary | ICD-10-CM

## 2018-12-09 MED ORDER — AMLODIPINE BESYLATE 5 MG PO TABS: 5.0000 mg | ORAL_TABLET | Freq: Every day | ORAL | 3 refills | Status: DC

## 2018-12-09 MED FILL — AMLODIPINE BESYLATE 5 MG TA: 5 | 30 days supply | Qty: 30 | Fill #0

## 2018-12-09 NOTE — Progress Notes (Signed)
Virtual Visit via Video Note  I connected with [place]  on 12/09/18 at  9:45 AM EDT by a video enabled telemedicine application and verified that I am speaking with the correct person using two identifiers.  Location patient: home Location provider:work or home office Persons participating in the virtual visit: patient, provider  I discussed the limitations of evaluation and management by telemedicine and the availability of in person appointments. The patient expressed understanding and agreed to proceed.   HPI:  Darrold Bezek is a pleasant 63 y.o. here for follow up. Chronic medical problems summarized below were reviewed for changes and stability and were updated as needed below. These issues and their treatment remain stable for the most part.   Reports feels fine. But, BP has been running a little high on average around 140s/90s sometimes. He has not been getting as much exercise because of the [place] pandemic. Has started getting on the elliptical daily - but sitting a lot more.   Feels like eating ok - less take out food. But snacking is easier staying at home. Denies CP, SOB, DOE, treatment intolerance or new symptoms.   Hypertension/Hyperlipidemia: -meds:  -continues lifestyle changes  GERD: -meds: PPI -used to take zantac, recent recall  ED: -meds: viagra prn  ROS: See pertinent positives and negatives per HPI.  Past Medical History:  Diagnosis Date  . GERD (gastroesophageal reflux disease)   . Hypertension     Past Surgical History:  Procedure Laterality Date  . APPENDECTOMY    . NASAL SEPTUM SURGERY    . TONSILLECTOMY    . UVULECTOMY      Family History  Problem Relation Age of Onset  . Heart block Father   . Cervical cancer Mother 43    SOCIAL HX: see HPI   Current Outpatient Medications:  .  Flaxseed, Linseed, (FLAX SEEDS PO), Take by mouth., Disp: , Rfl:  .  Multiple Vitamin (MULTIVITAMIN) capsule, Take 1 capsule by mouth daily., Disp: , Rfl:   .  omeprazole (PRILOSEC) 20 MG capsule, Take 20 mg by mouth every other day. Alternates with Zantac, Disp: , Rfl:  .  sildenafil (VIAGRA) 25 MG tablet, 1-3 tablets as needed prior to sexual intercourse. Do not use more then 1 dose in 24 hours, Disp: 10 tablet, Rfl: 3 .  amLODipine (NORVASC) 5 MG tablet, Take 1 tablet (5 mg total) by mouth daily., Disp: 30 tablet, Rfl: 3  EXAM:  VITALS per patient if applicable: Today's Vitals   12/09/18 0945  BP: (!) 149/90  T 98.0  There is no height or weight on file to calculate BMI.   GENERAL: alert, oriented, appears well and in no acute distress  HEENT: atraumatic, conjunttiva clear, no obvious abnormalities on inspection of external nose and ears  NECK: normal movements of the head and neck  LUNGS: on inspection no signs of respiratory distress, breathing rate appears normal, no obvious gross SOB, gasping or wheezing  CV: no obvious cyanosis  MS: moves all visible extremities without noticeable abnormality  PSYCH/NEURO: pleasant and cooperative, no obvious depression or anxiety, speech and thought processing grossly intact  ASSESSMENT AND PLAN:  Discussed the following assessment and plan:   Essential hypertension -discussed implications, risks, management, medication side effects -he opted to continue lifestyle changes and add norvasc -rx sent -follow up 2-4 weeks  Gastroesophageal reflux disease, esophagitis presence not specified -discussed recall of ranitidine and advised not to use -advised lifestyle recs and PPI  Overweight (BMI 25.0-29.9) -advised  healthy low sugar/low carb/low socium diet and regular aerobic exercise  Discussed risks, implications and management of hypertension.    I discussed the assessment and treatment plan with the patient. The patient was provided an opportunity to ask questions and all were answered. The patient agreed with the plan and demonstrated an understanding of the instructions.   The  patient was advised to call back or seek an in-person evaluation if the symptoms worsen or if the condition fails to improve as anticipated.   Follow up instructions: Advised assistant Wendie Simmer to help patient arrange the following: -follow up with Dr. Maudie Mercury in 2-4 weeks  Lucretia Kern, DO

## 2018-12-09 NOTE — Patient Instructions (Addendum)
Please stop taking the Zantac.  Use the Prilosec and see the recommendations below instead.  Start the Norvasc for blood pressure and take once daily in the morning.  Eat a healthy low sugar/low carb diet and continue regular aerobic exercise.  Follow up with me in about 2-4 weeks.    Food Choices for Gastroesophageal Reflux Disease, Adult When you have gastroesophageal reflux disease (GERD), the foods you eat and your eating habits are very important. Choosing the right foods can help ease your discomfort. Think about working with a nutrition specialist (dietitian) to help you make good choices. What are tips for following this plan?  Meals  Choose healthy foods that are low in fat, such as fruits, vegetables, whole grains, low-fat dairy products, and lean meat, fish, and poultry.  Eat small meals often instead of 3 large meals a day. Eat your meals slowly, and in a place where you are relaxed. Avoid bending over or lying down until 2-3 hours after eating.  Avoid eating meals 2-3 hours before bed.  Avoid drinking a lot of liquid with meals.  Cook foods using methods other than frying. Bake, grill, or broil food instead.  Avoid or limit: ? Chocolate. ? Peppermint or spearmint. ? Alcohol. ? Pepper. ? Black and decaffeinated coffee. ? Black and decaffeinated tea. ? Bubbly (carbonated) soft drinks. ? Caffeinated energy drinks and soft drinks.  Limit high-fat foods such as: ? Fatty meat or fried foods. ? Whole milk, cream, butter, or ice cream. ? Nuts and nut butters. ? Pastries, donuts, and sweets made with butter or shortening.  Avoid foods that cause symptoms. These foods may be different for everyone. Common foods that cause symptoms include: ? Tomatoes. ? Oranges, lemons, and limes. ? Peppers. ? Spicy food. ? Onions and garlic. ? Vinegar. Lifestyle  Maintain a healthy weight. Ask your doctor what weight is healthy for you. If you need to lose weight, work with  your doctor to do so safely.  Exercise for at least 30 minutes for 5 or more days each week, or as told by your doctor.  Wear loose-fitting clothes.  Do not smoke. If you need help quitting, ask your doctor.  Sleep with the head of your bed higher than your feet. Use a wedge under the mattress or blocks under the bed frame to raise the head of the bed. Summary  When you have gastroesophageal reflux disease (GERD), food and lifestyle choices are very important in easing your symptoms.  Eat small meals often instead of 3 large meals a day. Eat your meals slowly, and in a place where you are relaxed.  Limit high-fat foods such as fatty meat or fried foods.  Avoid bending over or lying down until 2-3 hours after eating.  Avoid peppermint and spearmint, caffeine, alcohol, and chocolate. This information is not intended to replace advice given to you by your health care provider. Make sure you discuss any questions you have with your health care provider. Document Released: 02/10/2012 Document Revised: 09/16/2016 Document Reviewed: 09/16/2016 Elsevier Interactive Patient Education  2019 [employer].

## 2018-12-23 ENCOUNTER — Encounter: Payer: Self-pay | Admitting: Family Medicine

## 2018-12-23 ENCOUNTER — Other Ambulatory Visit: Payer: Self-pay

## 2018-12-23 ENCOUNTER — Ambulatory Visit (INDEPENDENT_AMBULATORY_CARE_PROVIDER_SITE_OTHER): Payer: No Typology Code available for payment source | Admitting: Family Medicine

## 2018-12-23 VITALS — BP 149/79 | Temp 98.1°F

## 2018-12-23 DIAGNOSIS — I10 Essential (primary) hypertension: Secondary | ICD-10-CM | POA: Diagnosis not present

## 2018-12-23 NOTE — Progress Notes (Signed)
Virtual Visit via Video Note  I connected with [place]  on 12/23/18 at 11:30 AM EDT by a video enabled telemedicine application and verified that I am speaking with the correct person using two identifiers.  Location patient: home Location provider:work or home office Persons participating in the virtual visit: patient, provider  I discussed the limitations of evaluation and management by telemedicine and the availability of in person appointments. The patient expressed understanding and agreed to proceed.   HPI:  Follow up Hypertension: -taking Norvasc 5 mg daily for 1.5 weeks -BP running around 150/70-80s -trying to get on elliptical in the mornings -diet not as good -no CP, SOB, DOE, HA, swelling  ROS: See pertinent positives and negatives per HPI.  Past Medical History:  Diagnosis Date  . GERD (gastroesophageal reflux disease)   . Hypertension     Past Surgical History:  Procedure Laterality Date  . APPENDECTOMY    . NASAL SEPTUM SURGERY    . TONSILLECTOMY    . UVULECTOMY      Family History  Problem Relation Age of Onset  . Heart block Father   . Cervical cancer Mother 43    SOCIAL HX: see hpi   Current Outpatient Medications:  .  amLODipine (NORVASC) 5 MG tablet, Take 1 tablet (5 mg total) by mouth daily., Disp: 30 tablet, Rfl: 3 .  Flaxseed, Linseed, (FLAX SEEDS PO), Take by mouth., Disp: , Rfl:  .  Multiple Vitamin (MULTIVITAMIN) capsule, Take 1 capsule by mouth daily., Disp: , Rfl:  .  omeprazole (PRILOSEC) 20 MG capsule, Take 20 mg by mouth every other day. Alternates with Zantac, Disp: , Rfl:  .  sildenafil (VIAGRA) 25 MG tablet, 1-3 tablets as needed prior to sexual intercourse. Do not use more then 1 dose in 24 hours, Disp: 10 tablet, Rfl: 3  EXAM:  VITALS per patient if applicable: see above, BP 149/79 Today's Vitals   12/23/18 1114  BP: (!) 149/79  Temp: 98.1 F (36.7 C)   There is no height or weight on file to calculate BMI.   GENERAL:  alert, oriented, appears well and in no acute distress  HEENT: atraumatic, conjunttiva clear, no obvious abnormalities on inspection of external nose and ears  NECK: normal movements of the head and neck  LUNGS: on inspection no signs of respiratory distress, breathing rate appears normal, no obvious gross SOB, gasping or wheezing  CV: no obvious cyanosis  MS: moves all visible extremities without noticeable abnormality  PSYCH/NEURO: pleasant and cooperative, no obvious depression or anxiety, speech and thought processing grossly intact  ASSESSMENT AND PLAN:  Discussed the following assessment and plan:  Essential hypertension  BP improved but not at goal. Discussed options and encouraged a healthy diet and portion control. He is working on this and plans to use a program through [employer] to assist. Increase norvasc to 7.5mg  daily an dmonitor, if still high increase to 10mg . Shoot for a 5 lb weight reduction over next 1 month. Follow up in 1 month.   I discussed the assessment and treatment plan with the patient. The patient was provided an opportunity to ask questions and all were answered. The patient agreed with the plan and demonstrated an understanding of the instructions.   The patient was advised to call back or seek an in-person evaluation if the symptoms worsen or if the condition fails to improve as anticipated.   Follow up instructions: Advised assistant Wendie Simmer to help patient arrange the following: -follow up wt  and BP with Dr. Maudie Mercury in 3-4 weeks.   Lucretia Kern, DO

## 2019-01-03 MED FILL — AMLODIPINE BESYLATE 5 MG TA: 5 | 90 days supply | Qty: 90 | Fill #1

## 2019-01-03 MED FILL — SILDENAFIL CITRATE 25 MG TA: 25 | 30 days supply | Qty: 6 | Fill #0

## 2019-01-20 ENCOUNTER — Ambulatory Visit (INDEPENDENT_AMBULATORY_CARE_PROVIDER_SITE_OTHER): Payer: No Typology Code available for payment source | Admitting: Family Medicine

## 2019-01-20 ENCOUNTER — Encounter: Payer: Self-pay | Admitting: Family Medicine

## 2019-01-20 ENCOUNTER — Other Ambulatory Visit: Payer: Self-pay

## 2019-01-20 ENCOUNTER — Telehealth: Payer: Self-pay | Admitting: *Deleted

## 2019-01-20 VITALS — BP 140/90

## 2019-01-20 DIAGNOSIS — I10 Essential (primary) hypertension: Secondary | ICD-10-CM | POA: Diagnosis not present

## 2019-01-20 DIAGNOSIS — E669 Obesity, unspecified: Secondary | ICD-10-CM

## 2019-01-20 MED ORDER — BENAZEPRIL HCL 10 MG PO TABS: 10.0000 mg | ORAL_TABLET | Freq: Every day | ORAL | 3 refills | Status: DC

## 2019-01-20 MED FILL — BENAZEPRIL HCL 10 MG TABLET: 10 | 90 days supply | Qty: 90 | Fill #0

## 2019-01-20 NOTE — Telephone Encounter (Signed)
-----   Message from Lucretia Kern, DO sent at 01/20/2019 12:48 PM EDT ----- -follow up in 5-6 weeks with Dr. Maudie Mercury

## 2019-01-20 NOTE — Progress Notes (Signed)
Virtual Visit via Video Note  I connected with [place]  on 01/20/19 at 11:20 AM EDT by a video enabled telemedicine application and verified that I am speaking with the correct person using two identifiers.  Location patient: home Location provider:work or home office Persons participating in the virtual visit: patient, provider  I discussed the limitations of evaluation and management by telemedicine and the availability of in person appointments. The patient expressed understanding and agreed to proceed.   HPI:  Follow up Hypertension: -BP continues to run 140s/ 90s -he wonders if the norvasc is helping at all or not -admits activity level and diet not as good since the pandemic -benazapril worked pretty well for him in the past and he tolerated it well -no CP, SOB, DOE -FH heart dz  ROS: See pertinent positives and negatives per HPI.  Past Medical History:  Diagnosis Date  . GERD (gastroesophageal reflux disease)   . Hypertension     Past Surgical History:  Procedure Laterality Date  . APPENDECTOMY    . NASAL SEPTUM SURGERY    . TONSILLECTOMY    . UVULECTOMY      Family History  Problem Relation Age of Onset  . Heart block Father   . Cervical cancer Mother 62    SOCIAL HX: see hpi   Current Outpatient Medications:  .  amLODipine (NORVASC) 5 MG tablet, Take 1 tablet (5 mg total) by mouth daily. (Patient taking differently: Take 7.5 mg by mouth daily. ), Disp: 30 tablet, Rfl: 3 .  Flaxseed, Linseed, (FLAX SEEDS PO), Take by mouth., Disp: , Rfl:  .  Multiple Vitamin (MULTIVITAMIN) capsule, Take 1 capsule by mouth daily., Disp: , Rfl:  .  omeprazole (PRILOSEC) 20 MG capsule, Take 20 mg by mouth every other day. Alternates with Zantac, Disp: , Rfl:  .  sildenafil (VIAGRA) 25 MG tablet, 1-3 tablets as needed prior to sexual intercourse. Do not use more then 1 dose in 24 hours, Disp: 10 tablet, Rfl: 3 .  benazepril (LOTENSIN) 10 MG tablet, Take 1 tablet (10 mg total) by  mouth daily., Disp: 90 tablet, Rfl: 3  EXAM:  VITALS per patient if applicable:140/90  GENERAL: alert, oriented, appears well and in no acute distress  HEENT: atraumatic, conjunttiva clear, no obvious abnormalities on inspection of external nose and ears  NECK: normal movements of the head and neck  LUNGS: on inspection no signs of respiratory distress, breathing rate appears normal, no obvious gross SOB, gasping or wheezing  CV: no obvious cyanosis  MS: moves all visible extremities without noticeable abnormality  PSYCH/NEURO: pleasant and cooperative, no obvious depression or anxiety, speech and thought processing grossly intact  ASSESSMENT AND PLAN: More than 50% of over 25 minutes spent in total in caring for this patient was spent  counseling and/or coordinating care.  Discussed the following assessment and plan:  Essential hypertension  Obesity (BMI 30-39.9)  Discussed medication options and non-medication options for treating BP, risks, significance. We will try stopping he norvasc to see if helping at all, he will monitor BP. If is helping will add back with benazapril.  If not helping benazapril, then titrate as seemed to work in the past. Also spoke again at length about diet and exercise and the importance of a healthy whole foods based, low sugar, low carb diet and regular aerobic exercise. Follow up 5-6 weeks. BMP then. Advised to call or email if any questions in the interim.   I discussed the assessment and treatment  plan with the patient. The patient was provided an opportunity to ask questions and all were answered. The patient agreed with the plan and demonstrated an understanding of the instructions.   The patient was advised to call back or seek an in-person evaluation if the symptoms worsen or if the condition fails to improve as anticipated.   Follow up instructions: Advised assistant Wendie Simmer to help patient arrange the following: -follow up in 5-6 weeks  with Dr. Dimple Casey, DO

## 2019-01-20 NOTE — Patient Instructions (Signed)
Hold the Norvasc for 1 week to see if your blood pressure changes, check daily. If the medication was helping then continue 5mg  of Norvasc AND ADD the benazapril.  If no difference with stopping the norvasc then start the benazapril alone.  Please work on a healthy low sugar diet and regular aerobic exercise per below.  Follow up in 5- 6 weeks.   We recommend the following healthy lifestyle for LIFE: 1) Small portions. But, make sure to get regular (at least 3 per day), healthy meals and small healthy snacks if needed.  2) Eat a healthy clean diet.   TRY TO EAT: -at least 5-7 servings of low sugar, colorful, and nutrient rich vegetables per day (not corn, potatoes or bananas.) -berries are the best choice if you wish to eat fruit (only eat small amounts if trying to reduce weight)  -lean meets (fish, white meat of chicken or [place]) -vegan proteins for some meals - beans or tofu, whole grains, nuts and seeds -Replace bad fats with good fats - good fats include: fish, nuts and seeds, canola oil, olive oil -small amounts of low fat or non fat dairy -small amounts of100 % whole grains - check the lables -drink plenty of water  AVOID: -SUGAR, sweets, anything with added sugar, corn syrup or sweeteners - must read labels as even foods advertised as "healthy" often are loaded with sugar -if you must have a sweetener, small amounts of stevia may be best -sweetened beverages and artificially sweetened beverages -simple starches (rice, bread, potatoes, pasta, chips, etc - small amounts of 100% whole grains are ok) -red meat, pork, butter -fried foods, fast food, processed food, excessive dairy, eggs and coconut.  3)Get at least 150 minutes of sweaty aerobic exercise per week.  4)Reduce stress - consider counseling, meditation and relaxation to balance other aspects of your life.

## 2019-01-20 NOTE — Telephone Encounter (Signed)
I left a detailed message at the pts cell number to call back and schedule an appt as below.

## 2019-02-08 ENCOUNTER — Ambulatory Visit (INDEPENDENT_AMBULATORY_CARE_PROVIDER_SITE_OTHER): Payer: No Typology Code available for payment source | Admitting: Family Medicine

## 2019-02-08 ENCOUNTER — Other Ambulatory Visit: Payer: Self-pay

## 2019-02-08 DIAGNOSIS — R21 Rash and other nonspecific skin eruption: Secondary | ICD-10-CM | POA: Diagnosis not present

## 2019-02-08 MED ORDER — TRIAMCINOLONE ACETONIDE 0.1 % EX CREA: 1.0000 "application " | TOPICAL_CREAM | Freq: Two times a day (BID) | CUTANEOUS | 0 refills | Status: DC

## 2019-02-08 MED FILL — TRIAMCINOLONE 0.1% CREAM: 0.1 | 15 days supply | Qty: 30 | Fill #0

## 2019-02-08 NOTE — Progress Notes (Signed)
Virtual Visit via Video Note  I connected with [place]  on 02/08/19 at 12:00 PM EDT by a video enabled telemedicine application and verified that I am speaking with the correct person using two identifiers. Started with video, but video failed and completed with audio.  Location patient: home Location provider:work or home office Persons participating in the virtual visit: patient, provider   HPI:  Acute visit for a rash: -a week ago had a few red whelps on one hand, thought they were insect bites -the next day developed a few more -very itchy, did have a few blisters -one small strip on arm  -does work outside, chopping trees -OTC cortisone helps -no lesions on face, mucus membranes or pain  Reports BP doing better off norvasc and on benazapril.  ROS: See pertinent positives and negatives per HPI.  Past Medical History:  Diagnosis Date  . GERD (gastroesophageal reflux disease)   . Hypertension     Past Surgical History:  Procedure Laterality Date  . APPENDECTOMY    . NASAL SEPTUM SURGERY    . TONSILLECTOMY    . UVULECTOMY      Family History  Problem Relation Age of Onset  . Heart block Father   . Cervical cancer Mother 59    SOCIAL HX: see hpi   Current Outpatient Medications:  .  benazepril (LOTENSIN) 10 MG tablet, Take 1 tablet (10 mg total) by mouth daily., Disp: 90 tablet, Rfl: 3 .  Flaxseed, Linseed, (FLAX SEEDS PO), Take by mouth., Disp: , Rfl:  .  Multiple Vitamin (MULTIVITAMIN) capsule, Take 1 capsule by mouth daily., Disp: , Rfl:  .  omeprazole (PRILOSEC) 20 MG capsule, Take 20 mg by mouth every other day. Alternates with Zantac, Disp: , Rfl:  .  sildenafil (VIAGRA) 25 MG tablet, 1-3 tablets as needed prior to sexual intercourse. Do not use more then 1 dose in 24 hours, Disp: 10 tablet, Rfl: 3 .  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 Discussed options, potential etiologies, risks, return precautions. Given small area, opted to treat with topical triam cream 0.1 % twice daily for 2 weeks.    I discussed the assessment and treatment plan with the patient. The patient was provided an opportunity to ask questions and all were answered. The patient agreed with the plan and demonstrated an understanding of the instructions.   The patient was advised to call back or seek an in-person evaluation if the symptoms worsen or if the condition fails to improve as anticipated.  Spent approx 11 minutes of time in counseling and coordinating care.  Lucretia Kern, DO

## 2019-02-24 ENCOUNTER — Encounter: Payer: Self-pay | Admitting: Family Medicine

## 2019-02-24 ENCOUNTER — Telehealth: Payer: Self-pay | Admitting: *Deleted

## 2019-02-24 ENCOUNTER — Other Ambulatory Visit: Payer: Self-pay

## 2019-02-24 ENCOUNTER — Ambulatory Visit (INDEPENDENT_AMBULATORY_CARE_PROVIDER_SITE_OTHER): Payer: No Typology Code available for payment source | Admitting: Family Medicine

## 2019-02-24 VITALS — BP 128/79

## 2019-02-24 DIAGNOSIS — I10 Essential (primary) hypertension: Secondary | ICD-10-CM

## 2019-02-24 DIAGNOSIS — E785 Hyperlipidemia, unspecified: Secondary | ICD-10-CM

## 2019-02-24 DIAGNOSIS — Z125 Encounter for screening for malignant neoplasm of prostate: Secondary | ICD-10-CM | POA: Diagnosis not present

## 2019-02-24 NOTE — Telephone Encounter (Signed)
I left a detailed message for the pt to call back for a fasting lab appt as below.

## 2019-02-24 NOTE — Progress Notes (Signed)
Virtual Visit via Telephone Note  I connected with Lawrence Wang on 02/24/19 at 10:20 AM EDT by telephone and verified that I am speaking with the correct person using two identifiers. We attempted to connect by video but we were not successful.   I discussed the limitations, risks, security and privacy concerns of performing an evaluation and management service by telephone and the availability of in person appointments. I also discussed with the patient that there may be a patient responsible charge related to this service. The patient expressed understanding and agreed to proceed.  Location patient: home Location provider: work or home office Participants present for the call: patient, provider Patient did not have a visit in the prior 7 days to address this/these issue(s).   History of Present Illness:  Follow up HTN and rash. His blood pressure surprisingly did not come down on Norvasc. We stopped norvasc and started benazapril and he reports BP has been much better. 120s/70-80s,  128/79 today. Reports rash cleared up quickly with the triamcinilone and now is completely resolved. Wants to go ahead and get his labs ahead of upcoming physical as we need labs since started new BP med. Will check fasting lipids as has hx mild hyperlipidemia. Also, he understand risks/limitations of prostate cancer screening but would like to check the PSA for prostate cancer screening as he has done in the past.  Observations/Objective: Patient sounds cheerful and well on the phone. I do not appreciate any SOB. Speech and thought processing are grossly intact. Patient reported vitals: see hpi  Assessment and Plan: HTN Contact dermatitis Hyperlipidemia Prostate cancer screening  Continue benazapril with home BP monitoring. If more numbers in the 80s - increase benazapril to 20mg . CPE with Dr. Ethlyn Gallery as scheduled. Opted to do labs earlier since change in medication and went ahead and placed  orders. He will schedule a lab visit. Plans to follow up with me after CPE w/ Dr. Ethlyn Gallery. Discussed risks/limitations/benefits prostate cancer screening. He opted to do PSA. Advised healthy diet and exercise as we have at prior visits.  Follow Up Instructions:  I did not refer this patient for an OV in the next 24 hours for this/these issue(s).  I discussed the assessment and treatment plan with the patient. The patient was provided an opportunity to ask questions and all were answered. The patient agreed with the plan and demonstrated an understanding of the instructions.   The patient was advised to call back or seek an in-person evaluation if the symptoms worsen or if the condition fails to improve as anticipated.    Follow up instructions: Advised assistant Wendie Simmer to help patient arrange the following: -lab visit in next 1-2 weeks fasting -keep visit as scheduled for follow up  I provided 12 minutes of non-face-to-face time during this encounter.   Lucretia Kern, DO

## 2019-02-24 NOTE — Telephone Encounter (Signed)
-----   Message from Lucretia Kern, DO sent at 02/24/2019 11:17 AM EDT ----- -lab visit in next 1-2 weeks fasting-keep visit as scheduled for follow up

## 2019-03-28 ENCOUNTER — Other Ambulatory Visit: Payer: Self-pay

## 2019-03-28 ENCOUNTER — Ambulatory Visit (INDEPENDENT_AMBULATORY_CARE_PROVIDER_SITE_OTHER): Payer: No Typology Code available for payment source | Admitting: Family Medicine

## 2019-03-28 ENCOUNTER — Encounter: Payer: Self-pay | Admitting: Family Medicine

## 2019-03-28 VITALS — BP 102/80 | HR 74 | Temp 97.0°F | Ht 75.5 in | Wt 229.8 lb

## 2019-03-28 DIAGNOSIS — E785 Hyperlipidemia, unspecified: Secondary | ICD-10-CM

## 2019-03-28 DIAGNOSIS — K219 Gastro-esophageal reflux disease without esophagitis: Secondary | ICD-10-CM | POA: Diagnosis not present

## 2019-03-28 DIAGNOSIS — Z125 Encounter for screening for malignant neoplasm of prostate: Secondary | ICD-10-CM

## 2019-03-28 DIAGNOSIS — L989 Disorder of the skin and subcutaneous tissue, unspecified: Secondary | ICD-10-CM

## 2019-03-28 DIAGNOSIS — I10 Essential (primary) hypertension: Secondary | ICD-10-CM

## 2019-03-28 DIAGNOSIS — L309 Dermatitis, unspecified: Secondary | ICD-10-CM | POA: Diagnosis not present

## 2019-03-28 LAB — BASIC METABOLIC PANEL
BUN: 13 mg/dL (ref 6–23)
CO2: 28 mEq/L (ref 19–32)
Calcium: 9.3 mg/dL (ref 8.4–10.5)
Chloride: 102 mEq/L (ref 96–112)
Creatinine, Ser: 0.82 mg/dL (ref 0.40–1.50)
GFR: 95.42 mL/min (ref >60.00)
Glucose, Bld: 99 mg/dL (ref 70–99)
Potassium: 4.3 mEq/L (ref 3.5–5.1)
Sodium: 139 mEq/L (ref 135–145)

## 2019-03-28 LAB — CBC
HCT: 45 % (ref 39.0–52.0)
Hemoglobin: 15.1 g/dL (ref 13.0–17.0)
MCHC: 33.7 g/dL (ref 30.0–36.0)
MCV: 87.4 fl (ref 78.0–100.0)
Platelets: 185 10*3/uL (ref 150.0–400.0)
RBC: 5.14 Mil/uL (ref 4.22–5.81)
RDW: 13.8 % (ref 11.5–15.5)
WBC: 5 10*3/uL (ref 4.0–10.5)

## 2019-03-28 LAB — LIPID PANEL
Cholesterol: 192 mg/dL (ref 0–200)
HDL: 40.8 mg/dL (ref >39.00)
LDL Cholesterol: 122 mg/dL — ABNORMAL HIGH (ref 0–99)
NonHDL: 150.92
Total CHOL/HDL Ratio: 5
Triglycerides: 145 mg/dL (ref 0.0–149.0)
VLDL: 29 mg/dL (ref 0.0–40.0)

## 2019-03-28 LAB — HEMOGLOBIN A1C: Hgb A1c MFr Bld: 5.8 % (ref 4.6–6.5)

## 2019-03-28 LAB — PSA: PSA: 0.89 ng/mL (ref 0.10–4.00)

## 2019-03-28 MED ORDER — OMEPRAZOLE 20 MG PO CPDR: 20.0000 mg | DELAYED_RELEASE_CAPSULE | ORAL | Status: AC

## 2019-03-28 MED ORDER — BETAMETHASONE DIPROPIONATE 0.05 % EX CREA: TOPICAL_CREAM | Freq: Two times a day (BID) | CUTANEOUS | 1 refills | Status: DC

## 2019-03-28 MED FILL — BETAMETHASONE DIPROPIONATE: 0.05 | 15 days supply | Qty: 30 | Fill #0

## 2019-03-28 NOTE — Addendum Note (Signed)
Addended by: Diona Foley on: 03/28/2019 10:13 AM   Modules accepted: Orders

## 2019-03-28 NOTE — Progress Notes (Signed)
Lawrence Wang DOB: 1956/02/21 Encounter date: 03/28/2019  This isa 63 y.o. male who presents to transfer care from [facility]. Chief Complaint  Patient presents with  . Annual Exam   Last visit with [facility] in July; bloodwork ordered to complete today  History of present illness: Having some skin issues - 2 places on body- one is blood blister on pointer finger right - looks like blood blister but doesn't remember hurting anything.   Last winter left back of calf was itchy, scratching. Flares on and off. Never said anything to Dr. Maudie Mercury, but wife told him to discuss. He scratched enough that it calloused almost. Tried cortisone 10, tried kenalog - didn't make much difference. If he keeps it moisturized it helps. Hasn' thad similar patches like this before. Dives regularly so thinks this affects skin.   HTN: benazepril 10mg  daily. Tolerating well. Checks periodically at home - usually 130/80. Higher in 140's.   HL: diet controlled.   GERD: omeprazole 20mg  daily  Erectile dysfunction: sildenafil   Past Medical History:  Diagnosis Date  . GERD (gastroesophageal reflux disease)   . Hypertension    Past Surgical History:  Procedure Laterality Date  . APPENDECTOMY    . NASAL SEPTUM SURGERY    . TONSILLECTOMY    . UVULECTOMY     Allergies  Allergen Reactions  . Peanuts [Peanut Oil] Other (See Comments)    Headaches  . Zithromax [Azithromycin] Other (See Comments)    Hiccups-per patient   Current Meds  Medication Sig  . benazepril (LOTENSIN) 10 MG tablet Take 1 tablet (10 mg total) by mouth daily.  . Flaxseed, Linseed, (FLAX SEEDS PO) Take by mouth.  . Multiple Vitamin (MULTIVITAMIN) capsule Take 1 capsule by mouth daily.  Marland Kitchen omeprazole (PRILOSEC) 20 MG capsule Take 1 capsule (20 mg total) by mouth every other day.  . sildenafil (VIAGRA) 25 MG tablet 1-3 tablets as needed prior to sexual intercourse. Do not use more then 1 dose in 24 hours  . triamcinolone cream (KENALOG) 0.1 % Apply 1  application topically 2 (two) times daily.  . [DISCONTINUED] omeprazole (PRILOSEC) 20 MG capsule Take 20 mg by mouth every other day. Alternates with Zantac   Social History   Tobacco Use  . Smoking status: Never Smoker  . Smokeless tobacco: Never Used  Substance Use Topics  . Alcohol use: Yes    Alcohol/week: 0.0 standard drinks    Comment: glass of wine 4 nights per week   Family History  Problem Relation Age of Onset  . Heart block Father   . Cervical cancer Mother 43  . High blood pressure Mother   . Thyroid disease Mother      Review of Systems  Objective:  BP 102/80 (BP Location: Left Arm, Patient Position: Sitting, Cuff Size: Large)   Pulse 74   Temp (!) 97 F (36.1 C) (Temporal)   Ht 6' 3.5" (1.918 m)   Wt 229 lb 12.8 oz (104.2 kg)   SpO2 97%   BMI 28.34 kg/m   Weight: 229 lb 12.8 oz (104.2 kg)   BP Readings from Last 3 Encounters:  03/28/19 102/80  02/24/19 128/79  01/20/19 140/90   Wt Readings from Last 3 Encounters:  03/28/19 229 lb 12.8 oz (104.2 kg)  09/09/18 231 lb 4.8 oz (104.9 kg)  03/23/18 226 lb 8 oz (102.7 kg)    Physical Exam Constitutional:      General: He is not in acute distress.    Appearance: He  is well-developed.  Cardiovascular:     Rate and Rhythm: Normal rate and regular rhythm.     Heart sounds: Normal heart sounds. No murmur. No friction rub.  Pulmonary:     Effort: Pulmonary effort is normal. No respiratory distress.     Breath sounds: Normal breath sounds. No wheezing or rales.  Musculoskeletal:     Right lower leg: No edema.     Left lower leg: No edema.  Skin:    Comments: Left posterior calf - erythematous plaque with central scaling. There are smaller scaling lesions surrounding this. appox 4+cm diameter.  Right pointer finger there eis 13mm brown-red macule, nontender. Center is slightly darker than surrounding.  Neurological:     Mental Status: He is alert and oriented to person, place, and time.  Psychiatric:         Behavior: Behavior normal.     Assessment/Plan:  1. Gastroesophageal reflux disease, esophagitis presence not specified Cont with omeprazole. - omeprazole (PRILOSEC) 20 MG capsule; Take 1 capsule (20 mg total) by mouth every other day.  2. Essential hypertension Stable. Continue current medication. bloodwork today (already ordered by [facility])  3. Hyperlipidemia, unspecified hyperlipidemia type Has been stable; diet controlled.  4. Dermatitis Instructions discussed for care of dermatitis. Also derm referral placed for regular skin checks; but can follow up on this if not resolved with treatment. - betamethasone dipropionate (DIPROLENE) 0.05 % cream; Apply topically 2 (two) times daily.  Dispense: 30 g; Refill: 1  Return in about 6 months (around 09/28/2019) for physical exam.  Micheline Rough, MD

## 2019-03-28 NOTE — Patient Instructions (Signed)
Lawrence Wang is most gentle soap. Try to limit extensive water exposure.  Make sure skin is dry before applying the betamethasone. OK to coat steroid cream with thick moisturizing agent like eucerin/aquphor. Can also wrap leg with plastic wrap on top of cream/then sock at bedtime.   I will call you with bloodwork results once I get them.

## 2019-03-29 ENCOUNTER — Encounter: Payer: 59 | Admitting: Family Medicine

## 2019-04-11 ENCOUNTER — Telehealth: Payer: Self-pay | Admitting: Family Medicine

## 2019-04-11 NOTE — Telephone Encounter (Signed)
Patient needs a call back asap.  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  He is inquiring with specific questions to see if this is something Dr Ethlyn Gallery can do, so he is requesting a call back.  He is sidelined with this right now so he needs a call back asap.

## 2019-04-11 NOTE — Telephone Encounter (Signed)
Called pt and LMOVM to return call to office so we can verify if we can complete physical or not.

## 2019-04-12 NOTE — Telephone Encounter (Signed)
Pt is going to fax a copy of of the form for the cpe to [place]. Attn to Dr Ethlyn Gallery

## 2019-04-15 NOTE — Telephone Encounter (Signed)
Noted. FYI 

## 2019-04-15 NOTE — Telephone Encounter (Signed)
It will help to review form; on his last "scuba" physical it looks like spirometry was required. We cannot do this in the office but I could order for him if he would be ok with that; but this issue can be addressed after we receive form.

## 2019-04-19 NOTE — Telephone Encounter (Signed)
I called the pt and informed him of the message below and as of today, the form has not been received.  Patient stated he faxed this and will try to resend this or drop the form off.

## 2019-04-28 ENCOUNTER — Other Ambulatory Visit: Payer: Self-pay | Admitting: *Deleted

## 2019-04-28 DIAGNOSIS — Z0000 Encounter for general adult medical examination without abnormal findings: Secondary | ICD-10-CM

## 2019-04-28 MED FILL — BENAZEPRIL HCL 10 MG TABLET: 10 | 90 days supply | Qty: 90 | Fill #1

## 2019-04-29 MED ORDER — SILDENAFIL CITRATE 25 MG PO TABS: ORAL_TABLET | ORAL | 3 refills | Status: DC

## 2019-04-30 MED FILL — SILDENAFIL CITRATE 25 MG TA: 25 | 30 days supply | Qty: 6 | Fill #0

## 2019-05-03 ENCOUNTER — Telehealth: Payer: Self-pay | Admitting: Family Medicine

## 2019-05-03 NOTE — Telephone Encounter (Signed)
Patient needs a call back to see if he can get a referral for a diving physical.

## 2019-05-04 NOTE — Telephone Encounter (Signed)
Absolutely. Ok to place referral for him.   Last time I heard message he was getting paperwork to [place] about physical and what was required. But he had done in past with another provider and would be fine to refer out for this so that they can document more specifically about requirements for diving.

## 2019-05-05 NOTE — Telephone Encounter (Signed)
Patient called in and was informed of the message below.  Unfortunately I cannot find the order to enter and the pt stated he will get more information and call back.

## 2019-05-16 ENCOUNTER — Other Ambulatory Visit: Payer: Self-pay | Admitting: Family Medicine

## 2019-05-16 ENCOUNTER — Ambulatory Visit: Payer: Self-pay

## 2019-05-16 ENCOUNTER — Other Ambulatory Visit: Payer: Self-pay

## 2019-05-16 DIAGNOSIS — Z0000 Encounter for general adult medical examination without abnormal findings: Secondary | ICD-10-CM

## 2019-08-04 MED FILL — BENAZEPRIL HCL 10 MG TABLET: 10 | 90 days supply | Qty: 90 | Fill #2

## 2019-10-04 ENCOUNTER — Other Ambulatory Visit: Payer: Self-pay

## 2019-10-05 ENCOUNTER — Encounter: Payer: Self-pay | Admitting: Gastroenterology

## 2019-10-05 ENCOUNTER — Ambulatory Visit (INDEPENDENT_AMBULATORY_CARE_PROVIDER_SITE_OTHER): Payer: No Typology Code available for payment source | Admitting: Family Medicine

## 2019-10-05 ENCOUNTER — Encounter: Payer: Self-pay | Admitting: Family Medicine

## 2019-10-05 VITALS — BP 130/84 | HR 74 | Temp 98.1°F | Ht 76.0 in | Wt 233.2 lb

## 2019-10-05 DIAGNOSIS — Z0000 Encounter for general adult medical examination without abnormal findings: Secondary | ICD-10-CM

## 2019-10-05 DIAGNOSIS — E785 Hyperlipidemia, unspecified: Secondary | ICD-10-CM | POA: Diagnosis not present

## 2019-10-05 DIAGNOSIS — I10 Essential (primary) hypertension: Secondary | ICD-10-CM | POA: Diagnosis not present

## 2019-10-05 DIAGNOSIS — K219 Gastro-esophageal reflux disease without esophagitis: Secondary | ICD-10-CM | POA: Diagnosis not present

## 2019-10-05 DIAGNOSIS — Z1211 Encounter for screening for malignant neoplasm of colon: Secondary | ICD-10-CM

## 2019-10-05 DIAGNOSIS — R739 Hyperglycemia, unspecified: Secondary | ICD-10-CM

## 2019-10-05 NOTE — Progress Notes (Signed)
Lawrence Wang DOB: 12/14/1955 Encounter date: 10/05/2019  This is a 64 y.o. male who presents for complete physical   History of present illness/Additional concerns:  In the last week has struggled to sleep regularly. Tried melatonin in different forms. Just waking at 2-3am and sometimes can't go back to sleep. Tried tylenol pm which helps but if he takes it late then groggy in morning. Sometimes difficulty with falling asleep. Not linked to caffeine. Doesn't want to take anything nightly.   Hypertension: Benazepril 10 mg daily. Not checking regularly at home, but checks on occasion. It was 125/80's last week when giving blood.   GERD: Omeprazole 20 mg daily. Controlled on medication.   Hyperlipidemia: Diet controlled.  Erectile dysfunction: Sildenafil as needed  Having heater running tends to be harsh on skin. Area on left calf we discussed previously is much better. Tries to lotion right after shower. Betamethasone worked well, esp with plastic wrap.   Last colonoscopy he thinks was at age 83. Thinks small polyp.   Past Medical History:  Diagnosis Date  . GERD (gastroesophageal reflux disease)   . Hypertension    Past Surgical History:  Procedure Laterality Date  . APPENDECTOMY    . NASAL SEPTUM SURGERY    . TONSILLECTOMY    . UVULECTOMY     Allergies  Allergen Reactions  . Peanuts [Peanut Oil] Other (See Comments)    Headaches  . Zithromax [Azithromycin] Other (See Comments)    Hiccups-per patient   Current Meds  Medication Sig  . benazepril (LOTENSIN) 10 MG tablet Take 1 tablet (10 mg total) by mouth daily.  . betamethasone dipropionate (DIPROLENE) 0.05 % cream Apply topically 2 (two) times daily.  . Flaxseed, Linseed, (FLAX SEEDS PO) Take by mouth.  . Multiple Vitamin (MULTIVITAMIN) capsule Take 1 capsule by mouth daily.  Marland Kitchen omeprazole (PRILOSEC) 20 MG capsule Take 1 capsule (20 mg total) by mouth every other day.  . sildenafil (VIAGRA) 25 MG tablet 1-3 tablets as  needed prior to sexual intercourse. Do not use more then 1 dose in 24 hours  . triamcinolone cream (KENALOG) 0.1 % Apply 1 application topically 2 (two) times daily.   Social History   Tobacco Use  . Smoking status: Never Smoker  . Smokeless tobacco: Never Used  Substance Use Topics  . Alcohol use: Yes    Alcohol/week: 0.0 standard drinks    Comment: glass of wine 4 nights per week   Family History  Problem Relation Age of Onset  . Heart block Father   . Cervical cancer Mother 51  . High blood pressure Mother   . Thyroid disease Mother   . Lung cancer Brother   . Heart attack Maternal Grandfather   . Throat cancer Paternal Grandmother      Review of Systems  Constitutional: Negative for activity change, appetite change, chills, fatigue, fever and unexpected weight change.  HENT: Negative for congestion, ear pain, hearing loss, sinus pressure, sinus pain, sore throat and trouble swallowing.   Eyes: Negative for pain and visual disturbance.  Respiratory: Negative for cough, chest tightness, shortness of breath and wheezing.   Cardiovascular: Negative for chest pain, palpitations and leg swelling.  Gastrointestinal: Negative for abdominal distention, abdominal pain, blood in stool, constipation, diarrhea, nausea and vomiting.  Genitourinary: Negative for decreased urine volume, difficulty urinating, dysuria, penile pain and testicular pain.  Musculoskeletal: Negative for arthralgias, back pain and joint swelling.  Skin: Negative for rash.  Neurological: Negative for dizziness, weakness, numbness and  headaches.  Hematological: Negative for adenopathy. Does not bruise/bleed easily.  Psychiatric/Behavioral: Negative for agitation, sleep disturbance and suicidal ideas. The patient is not nervous/anxious.     CBC:  Lab Results  Component Value Date   WBC 5.0 03/28/2019   HGB 15.1 03/28/2019   HCT 45.0 03/28/2019   MCH 26.7 11/11/2014   MCHC 33.7 03/28/2019   RDW 13.8  03/28/2019   PLT 185.0 03/28/2019   MPV 8.9 11/11/2014   CMP: Lab Results  Component Value Date   NA 139 03/28/2019   K 4.3 03/28/2019   CL 102 03/28/2019   CO2 28 03/28/2019   GLUCOSE 99 03/28/2019   BUN 13 03/28/2019   CREATININE 0.82 03/28/2019   CREATININE 0.84 11/11/2014   CALCIUM 9.3 03/28/2019   PROT 6.6 11/11/2014   BILITOT 0.5 11/11/2014   ALKPHOS 15 (L) 11/11/2014   ALT 21 11/11/2014   AST 18 11/11/2014   LIPID: Lab Results  Component Value Date   CHOL 192 03/28/2019   TRIG 145.0 03/28/2019   HDL 40.80 03/28/2019   LDLCALC 122 (H) 03/28/2019    Objective:  BP 130/84 (BP Location: Right Arm, Patient Position: Sitting, Cuff Size: Large)   Pulse 74   Temp 98.1 F (36.7 C) (Temporal)   Ht 6\' 4"  (1.93 m)   Wt 233 lb 3.2 oz (105.8 kg)   SpO2 98%   BMI 28.39 kg/m   Weight: 233 lb 3.2 oz (105.8 kg)   BP Readings from Last 3 Encounters:  10/05/19 130/84  03/28/19 102/80  02/24/19 128/79   Wt Readings from Last 3 Encounters:  10/05/19 233 lb 3.2 oz (105.8 kg)  03/28/19 229 lb 12.8 oz (104.2 kg)  09/09/18 231 lb 4.8 oz (104.9 kg)    Physical Exam Constitutional:      General: He is not in acute distress.    Appearance: He is well-developed.  HENT:     Head: Normocephalic and atraumatic.     Right Ear: External ear normal.     Left Ear: External ear normal.     Nose: Nose normal.     Mouth/Throat:     Pharynx: No oropharyngeal exudate.  Eyes:     Conjunctiva/sclera: Conjunctivae normal.     Pupils: Pupils are equal, round, and reactive to light.  Neck:     Thyroid: No thyromegaly.  Cardiovascular:     Rate and Rhythm: Normal rate and regular rhythm.     Heart sounds: Normal heart sounds. No murmur. No friction rub. No gallop.   Pulmonary:     Effort: Pulmonary effort is normal. No respiratory distress.     Breath sounds: Normal breath sounds. No stridor. No wheezing or rales.  Abdominal:     General: Bowel sounds are normal.     Palpations:  Abdomen is soft.  Genitourinary:    Testes: Normal.        Right: Mass or tenderness not present.        Left: Mass or tenderness not present.  Musculoskeletal:        General: Normal range of motion.     Cervical back: Neck supple.  Skin:    General: Skin is warm and dry.  Neurological:     Mental Status: He is alert and oriented to person, place, and time.  Psychiatric:        Behavior: Behavior normal.        Thought Content: Thought content normal.        Judgment:  Judgment normal.     Assessment/Plan: There are no preventive care reminders to display for this patient. Health Maintenance reviewed - referral placed for colonoscopy.  1. Preventative health care Exercises daily; scuba dives as well. Eating healthy.  - PSA; Future  2. Essential hypertension Well controlled. Continue current medication. - CBC with Differential/Platelet; Future - Comprehensive metabolic panel; Future  3. Gastroesophageal reflux disease, unspecified whether esophagitis present Stable on omeprazole.   4. Hyperlipidemia, unspecified hyperlipidemia type Diet controlled. - Lipid panel; Future  5. Hyperglycemia Has been well controlled in past.   6. Colon cancer screening - Ambulatory referral to Gastroenterology    Return in about 6 months (around 04/03/2020) for bloodwork then [place].  Micheline Rough, MD

## 2019-10-06 ENCOUNTER — Encounter: Payer: Self-pay | Admitting: Family Medicine

## 2019-10-10 NOTE — Telephone Encounter (Signed)
Documents printed and given to the front desk manager to be scanned.

## 2019-10-19 ENCOUNTER — Telehealth: Payer: Self-pay | Admitting: Family Medicine

## 2019-10-19 NOTE — Telephone Encounter (Signed)
Orlando with [facility], called to say, pt is scheduled for an endoscopy for next month. Linward Foster would like to get an authorization and get this process started. Thanks     Orlando's # [phone]

## 2019-10-19 NOTE — Telephone Encounter (Signed)
Per Neoma Laming, our referral coordinator, we do not call [facility] regarding any testing other than a MRI or CT and the pt would need to call with testing date and info.  I called the pt, informed him of this and gave him the number below to call.

## 2019-10-21 ENCOUNTER — Ambulatory Visit (AMBULATORY_SURGERY_CENTER): Payer: Self-pay

## 2019-10-21 ENCOUNTER — Other Ambulatory Visit: Payer: Self-pay

## 2019-10-21 VITALS — Temp 96.9°F | Ht 75.0 in | Wt 235.0 lb

## 2019-10-21 DIAGNOSIS — Z01818 Encounter for other preprocedural examination: Secondary | ICD-10-CM

## 2019-10-21 DIAGNOSIS — Z1211 Encounter for screening for malignant neoplasm of colon: Secondary | ICD-10-CM

## 2019-10-21 MED ORDER — NA SULFATE-K SULFATE-MG SULF 17.5-3.13-1.6 GM/177ML PO SOLN: 1.0000 | Freq: Once | ORAL | 0 refills | Status: AC

## 2019-10-21 MED FILL — SUPREP BOWEL PREP KIT: 17.5-3.13-1 | 2 days supply | Qty: 354 | Fill #0

## 2019-10-21 NOTE — Progress Notes (Signed)
No egg or soy allergy known to patient  No issues with past sedation with any surgeries  or procedures, no intubation problems  No diet pills per patient No home 02 use per patient  No blood thinners per patient  Pt denies issues with constipation  No A fib or A flutter   Due to the COVID-19 pandemic we are asking patients to follow these guidelines. Please only bring one care partner. Please be aware that your care partner may wait in the car in the parking lot or if they feel like they will be too hot to wait in the car, they may wait in the lobby on the 4th floor. All care partners are required to wear a mask the entire time (we do not have any that we can provide them), they need to practice social distancing, and we will do a Covid check for all patient's and care partners when you arrive. Also we will check their temperature and your temperature. If the care partner waits in their car they need to stay in the parking lot the entire time and we will call them on their cell phone when the patient is ready for discharge so they can bring the car to the front of the building. Also all patient's will need to wear a mask into building.

## 2019-10-28 MED FILL — BENAZEPRIL HCL 10 MG TABLET: 10 | 90 days supply | Qty: 90 | Fill #3

## 2019-11-01 ENCOUNTER — Ambulatory Visit (INDEPENDENT_AMBULATORY_CARE_PROVIDER_SITE_OTHER): Payer: No Typology Code available for payment source

## 2019-11-01 ENCOUNTER — Other Ambulatory Visit: Payer: Self-pay | Admitting: Gastroenterology

## 2019-11-01 DIAGNOSIS — Z1159 Encounter for screening for other viral diseases: Secondary | ICD-10-CM

## 2019-11-02 ENCOUNTER — Encounter: Payer: Self-pay | Admitting: Gastroenterology

## 2019-11-02 LAB — SARS CORONAVIRUS 2 (TAT 6-24 HRS): SARS Coronavirus 2: NEGATIVE

## 2019-11-04 ENCOUNTER — Encounter: Payer: Self-pay | Admitting: Gastroenterology

## 2019-11-04 ENCOUNTER — Ambulatory Visit (AMBULATORY_SURGERY_CENTER): Payer: No Typology Code available for payment source | Admitting: Gastroenterology

## 2019-11-04 ENCOUNTER — Other Ambulatory Visit: Payer: Self-pay

## 2019-11-04 VITALS — BP 124/94 | HR 73 | Temp 96.9°F | Resp 19 | Ht 75.0 in | Wt 235.0 lb

## 2019-11-04 DIAGNOSIS — D120 Benign neoplasm of cecum: Secondary | ICD-10-CM

## 2019-11-04 DIAGNOSIS — Z1211 Encounter for screening for malignant neoplasm of colon: Secondary | ICD-10-CM | POA: Diagnosis not present

## 2019-11-04 DIAGNOSIS — K635 Polyp of colon: Secondary | ICD-10-CM | POA: Diagnosis not present

## 2019-11-04 MED ORDER — SODIUM CHLORIDE 0.9 % IV SOLN: 500.0000 mL | Freq: Once | INTRAVENOUS | Status: DC

## 2019-11-04 NOTE — Progress Notes (Signed)
Pt's states no medical or surgical changes since previsit or office visit.  Lawrence Wang Vitals- June

## 2019-11-04 NOTE — Progress Notes (Signed)
To PACU, VSS. Report to [email or web address] 

## 2019-11-04 NOTE — Patient Instructions (Signed)
HANDOUTS PROVIDED ON: POLYPS, DIVERTICULOSIS, & HEMORRHOIDS  The polyps removed today have been sent for pathology.  The results can take 1-3 weeks to receive.  When your next colonoscopy should occur will be based on the pathology results.    You may resume your previous diet and medication schedule.  Thank you for allowing us to care for you today!!!   YOU HAD AN ENDOSCOPIC PROCEDURE TODAY AT THE [facility]:   Refer to the procedure report that was given to you for any specific questions about what was found during the examination.  If the procedure report does not answer your questions, please call your gastroenterologist to clarify.  If you requested that your care partner not be given the details of your procedure findings, then the procedure report has been included in a sealed envelope for you to review at your convenience later.  YOU SHOULD EXPECT: Some feelings of bloating in the abdomen. Passage of more gas than usual.  Walking can help get rid of the air that was put into your GI tract during the procedure and reduce the bloating. If you had a lower endoscopy (such as a colonoscopy or flexible sigmoidoscopy) you may notice spotting of blood in your stool or on the toilet paper. If you underwent a bowel prep for your procedure, you may not have a normal bowel movement for a few days.  Please Note:  You might notice some irritation and congestion in your nose or some drainage.  This is from the oxygen used during your procedure.  There is no need for concern and it should clear up in a day or so.  SYMPTOMS TO REPORT IMMEDIATELY:   Following lower endoscopy (colonoscopy or flexible sigmoidoscopy):  Excessive amounts of blood in the stool  Significant tenderness or worsening of abdominal pains  Swelling of the abdomen that is new, acute  Fever of 100F or higher  For urgent or emergent issues, a gastroenterologist can be reached at any hour by calling [phone]. Do  not use MyChart messaging for urgent concerns.    DIET:  We do recommend a small meal at first, but then you may proceed to your regular diet.  Drink plenty of fluids but you should avoid alcoholic beverages for 24 hours.  ACTIVITY:  You should plan to take it easy for the rest of today and you should NOT DRIVE or use heavy machinery until tomorrow (because of the sedation medicines used during the test).    FOLLOW UP: Our staff will call the number listed on your records 48-72 hours following your procedure to check on you and address any questions or concerns that you may have regarding the information given to you following your procedure. If we do not reach you, we will leave a message.  We will attempt to reach you two times.  During this call, we will ask if you have developed any symptoms of COVID 19. If you develop any symptoms (ie: fever, flu-like symptoms, shortness of breath, cough etc.) before then, please call [phone].  If you test positive for Covid 19 in the 2 weeks post procedure, please call and report this information to us.    If any biopsies were taken you will be contacted by phone or by letter within the next 1-3 weeks.  Please call us at [phone] if you have not heard about the biopsies in 3 weeks.    SIGNATURES/CONFIDENTIALITY: You and/or your care partner have signed paperwork which will   be entered into your electronic medical record.  These signatures attest to the fact that that the information above on your After Visit Summary has been reviewed and is understood.  Full responsibility of the confidentiality of this discharge information lies with you and/or your care-partner. 

## 2019-11-04 NOTE — Op Note (Addendum)
[facility] Patient Name: Lawrence Wang Procedure Date: 11/04/2019 10:42 AM MRN: [id number] Endoscopist: Remo Lipps P. Havery Moros , MD Age: 64 Referring MD:  Date of Birth: 10/04/1955 Gender: Male Account #: [id number] Procedure:                Colonoscopy Indications:              Screening for colorectal malignant neoplasm Medicines:                Monitored Anesthesia Care Procedure:                Pre-Anesthesia Assessment:                           - Prior to the procedure, a History and Physical                            was performed, and patient medications and                            allergies were reviewed. The patient's tolerance of                            previous anesthesia was also reviewed. The risks                            and benefits of the procedure and the sedation                            options and risks were discussed with the patient.                            All questions were answered, and informed consent                            was obtained. Prior Anticoagulants: The patient has                            taken no previous anticoagulant or antiplatelet                            agents. ASA Grade Assessment: II - A patient with                            mild systemic disease. After reviewing the risks                            and benefits, the patient was deemed in                            satisfactory condition to undergo the procedure.                           After obtaining informed consent, the colonoscope  was passed under direct vision. Throughout the                            procedure, the patient's blood pressure, pulse, and                            oxygen saturations were monitored continuously. The                            Colonoscope was introduced through the anus and                            advanced to the the cecum, identified by                            appendiceal orifice and  ileocecal valve. The                            colonoscopy was technically difficult and complex                            due to a redundant colon and significant looping.                            The patient tolerated the procedure well. The                            quality of the bowel preparation was adequate. The                            ileocecal valve, appendiceal orifice, and rectum                            were photographed. Scope In: 10:47:36 AM Scope Out: 11:18:54 AM Scope Withdrawal Time: 0 hours 19 minutes 44 seconds  Total Procedure Duration: 0 hours 31 minutes 18 seconds  Findings:                 The perianal and digital rectal examinations were                            normal.                           Scattered medium-mouthed diverticula were found in                            the left colon.                           The colon was long and redundant with looping,                            technically challenging to achieve cecal intubation.  Two flat and sessile polyps were found in the                            cecum, one at the AO but not invading it (patient                            also with history of appendectomy). The polyps were                            6 to 7 mm in size. These polyps were removed with a                            cold snare. Resection and retrieval were complete.                           Internal hemorrhoids were found during retroflexion.                           The exam was otherwise without abnormality. Complications:            No immediate complications. Estimated blood loss:                            Minimal. Estimated Blood Loss:     Estimated blood loss was minimal. Impression:               - Diverticulosis in the left colon.                           - Redundant and long colon.                           - Two 6 to 7 mm polyps in the cecum, removed with a                            cold  snare. Resected and retrieved.                           - Internal hemorrhoids.                           - The examination was otherwise normal. Recommendation:           - Patient has a contact number available for                            emergencies. The signs and symptoms of potential                            delayed complications were discussed with the                            patient. Return to normal activities tomorrow.  Written discharge instructions were provided to the                            patient.                           - Resume previous diet.                           - Continue present medications.                           - Await pathology results. Remo Lipps P. Chyenne Sobczak, MD 11/04/2019 11:23:22 AM This report has been signed electronically.

## 2019-11-04 NOTE — Progress Notes (Signed)
Called to room to assist during endoscopic procedure.  Patient ID and intended procedure confirmed with present staff. Received instructions for my participation in the procedure from the performing physician.  

## 2019-11-08 ENCOUNTER — Telehealth: Payer: Self-pay | Admitting: *Deleted

## 2019-11-08 ENCOUNTER — Telehealth: Payer: Self-pay

## 2019-11-08 NOTE — Telephone Encounter (Signed)
Left message on answering machine. 

## 2019-11-08 NOTE — Telephone Encounter (Signed)
  Follow up Call-  Call back number 11/04/2019  Post procedure Call Back phone  # [phone]  Permission to leave phone message Yes  Some recent data might be hidden     Patient questions:  Do you have a fever, pain , or abdominal swelling? No. Pain Score  0 *  Have you tolerated food without any problems? Yes.    Have you been able to return to your normal activities? Yes.    Do you have any questions about your discharge instructions: Diet   No. Medications  No. Follow up visit  No.  Do you have questions or concerns about your Care? No.  Actions: * If pain score is 4 or above: 1. No action needed, pain <4.Have you developed a fever since your procedure? no  2.   Have you had an respiratory symptoms (SOB or cough) since your procedure? no  3.   Have you tested positive for COVID 19 since your procedure no  4.   Have you had any family members/close contacts diagnosed with the COVID 19 since your procedure?  no   If yes to any of these questions please route to Joylene John, RN and Alphonsa Gin, Therapist, sports.

## 2019-11-11 ENCOUNTER — Encounter: Payer: Self-pay | Admitting: Family Medicine

## 2019-11-11 DIAGNOSIS — H539 Unspecified visual disturbance: Secondary | ICD-10-CM

## 2019-11-11 DIAGNOSIS — I10 Essential (primary) hypertension: Secondary | ICD-10-CM

## 2019-12-13 MED FILL — SILDENAFIL CITRATE 25 MG TA: 25 | 30 days supply | Qty: 6 | Fill #1

## 2020-02-06 ENCOUNTER — Other Ambulatory Visit: Payer: Self-pay | Admitting: Family Medicine

## 2020-02-06 ENCOUNTER — Other Ambulatory Visit: Payer: Self-pay | Admitting: Adult Health

## 2020-02-06 MED ORDER — BENAZEPRIL HCL 10 MG PO TABS: 10.0000 mg | ORAL_TABLET | Freq: Every day | ORAL | 2 refills | Status: DC

## 2020-02-06 MED FILL — BENAZEPRIL HCL 10 MG TABLET: 10 | 90 days supply | Qty: 90 | Fill #0

## 2020-02-06 NOTE — Telephone Encounter (Signed)
Pt is requesting a refill on Benazepril 10 mg. Pt uses [facility]. Thanks

## 2020-02-06 NOTE — Addendum Note (Signed)
Addended by: Miles Costain T on: 02/06/2020 01:05 PM   Modules accepted: Orders

## 2020-02-06 NOTE — Telephone Encounter (Signed)
SENT TO THE PHARMACY BY E-SCRIBE. 

## 2020-03-12 NOTE — Addendum Note (Signed)
Addended by: Marrion Coy on: 03/12/2020 03:43 PM   Modules accepted: Orders

## 2020-03-29 ENCOUNTER — Other Ambulatory Visit: Payer: No Typology Code available for payment source

## 2020-03-30 ENCOUNTER — Other Ambulatory Visit: Payer: Self-pay

## 2020-03-30 ENCOUNTER — Other Ambulatory Visit (INDEPENDENT_AMBULATORY_CARE_PROVIDER_SITE_OTHER): Payer: No Typology Code available for payment source

## 2020-03-30 DIAGNOSIS — R739 Hyperglycemia, unspecified: Secondary | ICD-10-CM

## 2020-03-30 DIAGNOSIS — Z0000 Encounter for general adult medical examination without abnormal findings: Secondary | ICD-10-CM | POA: Diagnosis not present

## 2020-03-30 DIAGNOSIS — E785 Hyperlipidemia, unspecified: Secondary | ICD-10-CM

## 2020-03-30 DIAGNOSIS — I10 Essential (primary) hypertension: Secondary | ICD-10-CM

## 2020-03-31 LAB — LIPID PANEL
Cholesterol: 184 mg/dL (ref <200)
HDL: 47 mg/dL (ref >=40)
LDL Cholesterol (Calc): 120 mg/dL (calc) — ABNORMAL HIGH
Non-HDL Cholesterol (Calc): 137 mg/dL (calc) — ABNORMAL HIGH (ref <130)
Total CHOL/HDL Ratio: 3.9 (calc) (ref <5.0)
Triglycerides: 75 mg/dL (ref <150)

## 2020-03-31 LAB — CBC WITH DIFFERENTIAL/PLATELET
Absolute Monocytes: 667 cells/uL (ref 200–950)
Basophils Absolute: 41 cells/uL (ref 0–200)
Basophils Relative: 0.7 %
Eosinophils Absolute: 159 cells/uL (ref 15–500)
Eosinophils Relative: 2.7 %
HCT: 40.5 % (ref 38.5–50.0)
Hemoglobin: 12.8 g/dL — ABNORMAL LOW (ref 13.2–17.1)
Lymphs Abs: 1664 cells/uL (ref 850–3900)
MCH: 24.8 pg — ABNORMAL LOW (ref 27.0–33.0)
MCHC: 31.6 g/dL — ABNORMAL LOW (ref 32.0–36.0)
MCV: 78.3 fL — ABNORMAL LOW (ref 80.0–100.0)
MPV: 9.6 fL (ref 7.5–12.5)
Monocytes Relative: 11.3 %
Neutro Abs: 3369 cells/uL (ref 1500–7800)
Neutrophils Relative %: 57.1 %
Platelets: 265 10*3/uL (ref 140–400)
RBC: 5.17 10*6/uL (ref 4.20–5.80)
RDW: 14.2 % (ref 11.0–15.0)
Total Lymphocyte: 28.2 %
WBC: 5.9 10*3/uL (ref 3.8–10.8)

## 2020-03-31 LAB — COMPREHENSIVE METABOLIC PANEL
AG Ratio: 1.9 (calc) (ref 1.0–2.5)
ALT: 17 U/L (ref 9–46)
AST: 21 U/L (ref 10–35)
Albumin: 4.2 g/dL (ref 3.6–5.1)
Alkaline phosphatase (APISO): 17 U/L — ABNORMAL LOW (ref 35–144)
BUN: 14 mg/dL (ref 7–25)
CO2: 26 mmol/L (ref 20–32)
Calcium: 9.2 mg/dL (ref 8.6–10.3)
Chloride: 103 mmol/L (ref 98–110)
Creat: 0.88 mg/dL (ref 0.70–1.25)
Globulin: 2.2 g/dL (calc) (ref 1.9–3.7)
Glucose, Bld: 102 mg/dL — ABNORMAL HIGH (ref 65–99)
Potassium: 4.5 mmol/L (ref 3.5–5.3)
Sodium: 139 mmol/L (ref 135–146)
Total Bilirubin: 0.6 mg/dL (ref 0.2–1.2)
Total Protein: 6.4 g/dL (ref 6.1–8.1)

## 2020-03-31 LAB — HEMOGLOBIN A1C
Hgb A1c MFr Bld: 5.7 % of total Hgb — ABNORMAL HIGH (ref <5.7)
Mean Plasma Glucose: 117 (calc)
eAG (mmol/L): 6.5 (calc)

## 2020-03-31 LAB — PSA: PSA: 0.9 ng/mL (ref <=4.0)

## 2020-04-04 ENCOUNTER — Ambulatory Visit (INDEPENDENT_AMBULATORY_CARE_PROVIDER_SITE_OTHER): Payer: No Typology Code available for payment source | Admitting: Family Medicine

## 2020-04-04 ENCOUNTER — Other Ambulatory Visit: Payer: Self-pay

## 2020-04-04 ENCOUNTER — Encounter: Payer: Self-pay | Admitting: Family Medicine

## 2020-04-04 VITALS — BP 118/78 | HR 82 | Temp 98.7°F | Ht 75.0 in | Wt 226.3 lb

## 2020-04-04 DIAGNOSIS — L309 Dermatitis, unspecified: Secondary | ICD-10-CM | POA: Diagnosis not present

## 2020-04-04 DIAGNOSIS — D649 Anemia, unspecified: Secondary | ICD-10-CM | POA: Diagnosis not present

## 2020-04-04 MED ORDER — BETAMETHASONE DIPROPIONATE 0.05 % EX CREA: TOPICAL_CREAM | Freq: Two times a day (BID) | CUTANEOUS | 1 refills | Status: DC

## 2020-04-04 MED FILL — BETAMETHASONE DP 0.05% CRM: 0.05 | 15 days supply | Qty: 30 | Fill #0

## 2020-04-04 NOTE — Patient Instructions (Signed)
Ferrous sulfate 325mg  (or 65 elemental iron) three days/week. Absorption is increased with citrus (orange juice).

## 2020-04-04 NOTE — Progress Notes (Signed)
Cline Draheim DOB: 1955-08-31 Encounter date: 04/04/2020  This is a 64 y.o. male who presents with Chief Complaint  Patient presents with  . Follow-up    History of present illness:  Has had a couple of unnerving incidents. Couple of weeks ago was out at daughter's house and got very out of breath and dizzy. Episode passed once he cooled off. Last week was buying new car and at dealership (this episode was about 8pm and hadn't eaten dinner) and stood up after sitting for a bit and had a momentary light headedness. With first episode was taking apart an old trailer - taking apart pieces, sawing, carrying heavy pieces. Had to stop and catch breath multiple times. No heart racing. More dizziness and needed to rest to breathe and let it pass. No blood in stool or urine. Had taken viagra that morning.   Once or twice in life hb has been too low to give blood.   When asked about weight being down - states trying to exercise more regularly. Doing eliptypical at least an hour a day. Feels like he reached plateau at 220.   HTN: hasn't been checking regularly.     Allergies  Allergen Reactions  . Peanuts [Peanut Oil] Other (See Comments)    Headaches  . Zithromax [Azithromycin] Other (See Comments)    Hiccups-per patient   Current Meds  Medication Sig  . benazepril (LOTENSIN) 10 MG tablet Take 1 tablet (10 mg total) by mouth daily.  . betamethasone dipropionate 0.05 % cream Apply topically 2 (two) times daily.  . Flaxseed, Linseed, (FLAX SEEDS PO) Take by mouth.  . Multiple Vitamin (MULTIVITAMIN) capsule Take 1 capsule by mouth daily.  Marland Kitchen omeprazole (PRILOSEC) 20 MG capsule Take 1 capsule (20 mg total) by mouth every other day.  . sildenafil (VIAGRA) 25 MG tablet 1-3 tablets as needed prior to sexual intercourse. Do not use more then 1 dose in 24 hours  . [DISCONTINUED] betamethasone dipropionate (DIPROLENE) 0.05 % cream Apply topically 2 (two) times daily.    Review of Systems   Constitutional: Negative for chills, fatigue and fever.  Respiratory: Negative for cough, chest tightness, shortness of breath and wheezing.   Cardiovascular: Negative for chest pain, palpitations and leg swelling.    Objective:  BP 118/78 (BP Location: Left Arm, Patient Position: Sitting, Cuff Size: Large)   Pulse 82   Temp 98.7 F (37.1 C) (Oral)   Ht 6\' 3"  (1.905 m)   Wt 226 lb 4.8 oz (102.6 kg)   SpO2 97%   BMI 28.29 kg/m   Weight: 226 lb 4.8 oz (102.6 kg)   BP Readings from Last 3 Encounters:  04/04/20 118/78  11/04/19 (!) 124/94  10/05/19 130/84   Wt Readings from Last 3 Encounters:  04/04/20 226 lb 4.8 oz (102.6 kg)  11/04/19 235 lb (106.6 kg)  10/21/19 235 lb (106.6 kg)    Physical Exam Constitutional:      General: He is not in acute distress.    Appearance: He is well-developed.  Cardiovascular:     Rate and Rhythm: Normal rate and regular rhythm.     Heart sounds: Normal heart sounds. No murmur heard.  No friction rub.  Pulmonary:     Effort: Pulmonary effort is normal. No respiratory distress.     Breath sounds: Normal breath sounds. No wheezing or rales.  Musculoskeletal:     Right lower leg: No edema.     Left lower leg: No edema.  Neurological:  Mental Status: He is alert and oriented to person, place, and time.  Psychiatric:        Behavior: Behavior normal.     Assessment/Plan  1. Anemia, unspecified type Uncertain cause. - CBC with Differential/Platelet; Future - Vitamin B12; Future - Iron, TIBC and Ferritin Panel; Future - Urinalysis; Future - Ferritin; Future  2. Dermatitis Continued to have dermatitis left calf. Improves with steroid cream, but recurs.  - Ambulatory referral to Dermatology - betamethasone dipropionate 0.05 % cream; Apply topically 2 (two) times daily.  Dispense: 30 g; Refill: 1    Return in about 1 month (around 05/05/2020) for lab visit in 1 mo time.    Micheline Rough, MD

## 2020-05-07 MED FILL — BENAZEPRIL HCL 10 MG TABLET: 10 | 90 days supply | Qty: 90 | Fill #1

## 2020-05-08 ENCOUNTER — Other Ambulatory Visit: Payer: No Typology Code available for payment source

## 2020-05-08 ENCOUNTER — Other Ambulatory Visit: Payer: Self-pay

## 2020-05-08 DIAGNOSIS — D649 Anemia, unspecified: Secondary | ICD-10-CM

## 2020-05-09 ENCOUNTER — Encounter: Payer: Self-pay | Admitting: Family Medicine

## 2020-05-09 LAB — CBC WITH DIFFERENTIAL/PLATELET
Absolute Monocytes: 576 cells/uL (ref 200–950)
Basophils Absolute: 29 cells/uL (ref 0–200)
Basophils Relative: 0.5 %
Eosinophils Absolute: 137 cells/uL (ref 15–500)
Eosinophils Relative: 2.4 %
HCT: 41.1 % (ref 38.5–50.0)
Hemoglobin: 13.1 g/dL — ABNORMAL LOW (ref 13.2–17.1)
Lymphs Abs: 1482 cells/uL (ref 850–3900)
MCH: 25.6 pg — ABNORMAL LOW (ref 27.0–33.0)
MCHC: 31.9 g/dL — ABNORMAL LOW (ref 32.0–36.0)
MCV: 80.3 fL (ref 80.0–100.0)
MPV: 9.5 fL (ref 7.5–12.5)
Monocytes Relative: 10.1 %
Neutro Abs: 3477 cells/uL (ref 1500–7800)
Neutrophils Relative %: 61 %
Platelets: 259 10*3/uL (ref 140–400)
RBC: 5.12 10*6/uL (ref 4.20–5.80)
RDW: 16.9 % — ABNORMAL HIGH (ref 11.0–15.0)
Total Lymphocyte: 26 %
WBC: 5.7 10*3/uL (ref 3.8–10.8)

## 2020-05-09 LAB — IRON,TIBC AND FERRITIN PANEL
%SAT: 13 % (calc) — ABNORMAL LOW (ref 20–48)
Ferritin: 7 ng/mL — ABNORMAL LOW (ref 24–380)
Iron: 47 ug/dL — ABNORMAL LOW (ref 50–180)
TIBC: 373 mcg/dL (calc) (ref 250–425)

## 2020-05-09 LAB — URINALYSIS
Bilirubin Urine: NEGATIVE
Glucose, UA: NEGATIVE
Hgb urine dipstick: NEGATIVE
Ketones, ur: NEGATIVE
Leukocytes,Ua: NEGATIVE
Nitrite: NEGATIVE
Protein, ur: NEGATIVE
Specific Gravity, Urine: 1.02 (ref 1.001–1.03)
pH: 6.5 (ref 5.0–8.0)

## 2020-05-09 LAB — VITAMIN B12: Vitamin B-12: 608 pg/mL (ref 200–1100)

## 2020-05-15 ENCOUNTER — Other Ambulatory Visit: Payer: Self-pay | Admitting: Family Medicine

## 2020-05-15 DIAGNOSIS — D509 Iron deficiency anemia, unspecified: Secondary | ICD-10-CM

## 2020-07-27 ENCOUNTER — Other Ambulatory Visit: Payer: Self-pay | Admitting: Family Medicine

## 2020-07-27 DIAGNOSIS — Z0000 Encounter for general adult medical examination without abnormal findings: Secondary | ICD-10-CM

## 2020-07-27 MED FILL — SILDENAFIL CITRATE 25 MG TA: 25 | 30 days supply | Qty: 6 | Fill #0

## 2020-08-05 MED FILL — BENAZEPRIL HCL 10 MG TABLET: 10 | 90 days supply | Qty: 90 | Fill #2

## 2020-08-15 ENCOUNTER — Other Ambulatory Visit: Payer: Self-pay

## 2020-08-15 ENCOUNTER — Other Ambulatory Visit (INDEPENDENT_AMBULATORY_CARE_PROVIDER_SITE_OTHER): Payer: No Typology Code available for payment source

## 2020-08-15 DIAGNOSIS — D509 Iron deficiency anemia, unspecified: Secondary | ICD-10-CM | POA: Diagnosis not present

## 2020-08-16 LAB — CBC WITH DIFFERENTIAL/PLATELET
Absolute Monocytes: 631 cells/uL (ref 200–950)
Basophils Absolute: 41 cells/uL (ref 0–200)
Basophils Relative: 0.7 %
Eosinophils Absolute: 153 cells/uL (ref 15–500)
Eosinophils Relative: 2.6 %
HCT: 46.4 % (ref 38.5–50.0)
Hemoglobin: 15 g/dL (ref 13.2–17.1)
Lymphs Abs: 2047 cells/uL (ref 850–3900)
MCH: 27.5 pg (ref 27.0–33.0)
MCHC: 32.3 g/dL (ref 32.0–36.0)
MCV: 85 fL (ref 80.0–100.0)
MPV: 9.5 fL (ref 7.5–12.5)
Monocytes Relative: 10.7 %
Neutro Abs: 3027 cells/uL (ref 1500–7800)
Neutrophils Relative %: 51.3 %
Platelets: 255 10*3/uL (ref 140–400)
RBC: 5.46 10*6/uL (ref 4.20–5.80)
RDW: 13.6 % (ref 11.0–15.0)
Total Lymphocyte: 34.7 %
WBC: 5.9 10*3/uL (ref 3.8–10.8)

## 2020-08-16 LAB — IRON,TIBC AND FERRITIN PANEL
%SAT: 13 % (calc) — ABNORMAL LOW (ref 20–48)
Ferritin: 8 ng/mL — ABNORMAL LOW (ref 24–380)
Iron: 53 ug/dL (ref 50–180)
TIBC: 402 mcg/dL (calc) (ref 250–425)

## 2020-11-05 ENCOUNTER — Other Ambulatory Visit: Payer: Self-pay | Admitting: Adult Health

## 2020-11-07 ENCOUNTER — Other Ambulatory Visit: Payer: Self-pay | Admitting: Family Medicine

## 2020-11-07 NOTE — Telephone Encounter (Signed)
  benazepril (LOTENSIN) 10 MG tablet  [place], [place] Phone:  [phone]  Fax:  [phone]

## 2021-01-17 ENCOUNTER — Other Ambulatory Visit: Payer: Self-pay

## 2021-01-18 ENCOUNTER — Ambulatory Visit: Payer: No Typology Code available for payment source | Admitting: Family Medicine

## 2021-01-18 ENCOUNTER — Ambulatory Visit (INDEPENDENT_AMBULATORY_CARE_PROVIDER_SITE_OTHER): Payer: No Typology Code available for payment source | Admitting: Family Medicine

## 2021-01-18 ENCOUNTER — Encounter: Payer: Self-pay | Admitting: Family Medicine

## 2021-01-18 ENCOUNTER — Other Ambulatory Visit (HOSPITAL_COMMUNITY): Payer: Self-pay

## 2021-01-18 VITALS — BP 140/92 | HR 85 | Temp 98.8°F | Wt 220.6 lb

## 2021-01-18 DIAGNOSIS — M7521 Bicipital tendinitis, right shoulder: Secondary | ICD-10-CM | POA: Diagnosis not present

## 2021-01-18 DIAGNOSIS — M25511 Pain in right shoulder: Secondary | ICD-10-CM | POA: Diagnosis not present

## 2021-01-18 DIAGNOSIS — I10 Essential (primary) hypertension: Secondary | ICD-10-CM

## 2021-01-18 MED ORDER — CYCLOBENZAPRINE HCL 5 MG PO TABS
5.0000 mg | ORAL_TABLET | Freq: Three times a day (TID) | ORAL | 0 refills | Status: DC | PRN
  Filled 2021-01-18: qty 30, 10d supply, fill #0

## 2021-01-18 MED ORDER — MELOXICAM 7.5 MG PO TABS
7.5000 mg | ORAL_TABLET | Freq: Every day | ORAL | 0 refills | Status: DC
  Filled 2021-01-18: qty 30, 30d supply, fill #0

## 2021-01-18 NOTE — Progress Notes (Signed)
Subjective:    Patient ID: Lawrence Wang, male    DOB: [birth year], 65 y.o.   MRN: [id number]  Chief Complaint  Patient presents with  . Shoulder Pain    Rt shoulder pain for 3 days. Last 2 days patient stated it was unbearable, sharp shooting pain when moving, has it in a sling and states pain is dull now. Had ibuprofen for pain, but stated has not helped    HPI Patient was seen today for acute concern as mentioned above.  Patient endorses right shoulder pain which worsened in the last 3 days.  Initially noted last Friday after scuba diving.  Pain noted as sharp shooting from right shoulder down to her wrist.  Patient wearing a scarf as a sling which helps shoulder.  Ibuprofen has not helped.  Patient denies injury, falls, edema or erythema.  Past Medical History:  Diagnosis Date  . GERD (gastroesophageal reflux disease)   . Hypertension     Allergies  Allergen Reactions  . Peanuts [Peanut Oil] Other (See Comments)    Headaches  . Zithromax [Azithromycin] Other (See Comments)    Hiccups-per patient    ROS General: Denies fever, chills, night sweats, changes in weight, changes in appetite HEENT: Denies headaches, ear pain, changes in vision, rhinorrhea, sore throat CV: Denies CP, palpitations, SOB, orthopnea Pulm: Denies SOB, cough, wheezing GI: Denies abdominal pain, nausea, vomiting, diarrhea, constipation GU: Denies dysuria, hematuria, frequency Msk: Denies muscle cramps, joint pains + right shoulder/arm pain Neuro: Denies weakness, numbness, tingling Skin: Denies rashes, bruising Psych: Denies depression, anxiety, hallucinations      Objective:    Blood pressure (!) 140/92, pulse 85, temperature 98.8 F (37.1 C), temperature source Oral, weight 220 lb 9.6 oz (100.1 kg), SpO2 96 %.  Gen. Pleasant, well-nourished, in no distress, normal affect   HEENT: [place]/AT, face symmetric, conjunctiva clear, no scleral icterus, PERRLA, EOMI, nares patent without drainage Lungs: no  accessory muscle use Cardiovascular: RRR, no peripheral edema Musculoskeletal: extreme TTP of anterior R shoulder and lateral R clavicle.  Limited active and passive ROM 2/2 pt discomfort.discomfort with internal and external rotation of the upper right arm no deformities.  no cyanosis or clubbing, normal tone Neuro:  A&Ox3, CN II-XII intact, normal gait Skin:  Warm, no lesions/ rash   Wt Readings from Last 3 Encounters:  01/18/21 220 lb 9.6 oz (100.1 kg)  04/04/20 226 lb 4.8 oz (102.6 kg)  11/04/19 235 lb (106.6 kg)    Lab Results  Component Value Date   WBC 5.9 08/15/2020   HGB 15.0 08/15/2020   HCT 46.4 08/15/2020   PLT 255 08/15/2020   GLUCOSE 102 (H) 03/30/2020   CHOL 184 03/30/2020   TRIG 75 03/30/2020   HDL 47 03/30/2020   LDLCALC 120 (H) 03/30/2020   ALT 17 03/30/2020   AST 21 03/30/2020   NA 139 03/30/2020   K 4.5 03/30/2020   CL 103 03/30/2020   CREATININE 0.88 03/30/2020   BUN 14 03/30/2020   CO2 26 03/30/2020   PSA 0.9 03/30/2020   HGBA1C 5.7 (H) 03/30/2020    Assessment/Plan:  Acute pain of right shoulder  -Discussed possible causes including tendinitis 2/2 overuse, also consider rotator cuff tear or labral tear -Discussed imaging.  Unable to obtain x-ray in office is unavailable -Continue supportive care -Mobic, Flexeril -Referral placed to Ortho - Plan: Ambulatory referral to Orthopedic Surgery, meloxicam (MOBIC) 7.5 MG tablet, cyclobenzaprine (FLEXERIL) 5 MG tablet  Tendinitis of long head of biceps  brachii of right shoulder  -discussed supportive care including ice, heat, rest, topical analgesics, NSAIDs, Tylenol, massage -Given handout - Plan: Ambulatory referral to Orthopedic Surgery, meloxicam (MOBIC) 7.5 MG tablet  Essential hypertension -Elevated -Likely 2/2 pain -Continue current medications -Use NSAIDs sparingly F/u as needed  Grier Mitts, MD

## 2021-01-18 NOTE — Patient Instructions (Addendum)
Shoulder Pain Many things can cause shoulder pain, including:  An injury to the shoulder.  Overuse of the shoulder.  Arthritis. The source of the pain can be:  Inflammation.  An injury to the shoulder joint.  An injury to a tendon, ligament, or bone. Follow these instructions at home: Pay attention to changes in your symptoms. Let your health care provider know about them. Follow these instructions to relieve your pain. If you have a sling:  Wear the sling as told by your health care provider. Remove it only as told by your health care provider.  Loosen the sling if your fingers tingle, become numb, or turn cold and blue.  Keep the sling clean.  If the sling is not waterproof: ? Do not let it get wet. Remove it to shower or bathe.  Move your arm as little as possible, but keep your hand moving to prevent swelling. Managing pain, stiffness, and swelling  If directed, put ice on the painful area: ? Put ice in a plastic bag. ? Place a towel between your skin and the bag. ? Leave the ice on for 20 minutes, 2-3 times per day. Stop applying ice if it does not help with the pain.  Squeeze a soft ball or a foam pad as much as possible. This helps to keep the shoulder from swelling. It also helps to strengthen the arm.   General instructions  Take over-the-counter and prescription medicines only as told by your health care provider.  Keep all follow-up visits as told by your health care provider. This is important. Contact a health care provider if:  Your pain gets worse.  Your pain is not relieved with medicines.  New pain develops in your arm, hand, or fingers. Get help right away if:  Your arm, hand, or fingers: ? Tingle. ? Become numb. ? Become swollen. ? Become painful. ? Turn white or blue. Summary  Shoulder pain can be caused by an injury, overuse, or arthritis.  Pay attention to changes in your symptoms. Let your health care provider know about  them.  This condition may be treated with a sling, ice, and pain medicines.  Contact your health care provider if the pain gets worse or new pain develops. Get help right away if your arm, hand, or fingers tingle or become numb, swollen, or painful.  Keep all follow-up visits as told by your health care provider. This is important. This information is not intended to replace advice given to you by your health care provider. Make sure you discuss any questions you have with your health care provider. Document Revised: 02/23/2018 Document Reviewed: 02/23/2018 Elsevier Patient Education  2021 [place].  Tendinitis  Tendinitis is inflammation of a tendon. A tendon is a strong cord of tissue that connects muscle to bone. Tendinitis can affect any tendon, but it most commonly affects the:  Shoulder tendon (rotator cuff).  Ankle tendon (Achilles tendon).  Elbow tendon (triceps tendon).  Tendons in the wrist. What are the causes? This condition may be caused by:  Overusing a tendon or muscle. This is common.  Age-related wear and tear.  Injury.  Inflammatory conditions, such as arthritis.  Certain medicines. What increases the risk? You are more likely to develop this condition if you do activities that involve the same movements over and over again (repetitive motions). What are the signs or symptoms? Symptoms of this condition may include:  Pain.  Tenderness.  Mild swelling.  Decreased range of motion. How is  this diagnosed? This condition is diagnosed with a physical exam. You may also have tests, such as:  Ultrasound. This uses sound waves to make an image of the inside of your body in the affected area.  MRI. How is this treated? This condition may be treated by resting, icing, applying pressure (compression), and raising (elevating) the affected area above the level of your heart. This is known as RICE therapy. Treatment may also include:  Medicines to help  reduce inflammation or to help reduce pain.  Exercises or physical therapy to strengthen and stretch the tendon.  A brace or splint.  Surgery. This is rarely needed. Follow these instructions at home: If you have a splint or brace:  Wear the splint or brace as told by your health care provider. Remove it only as told by your health care provider.  Loosen the splint or brace if your fingers or toes tingle, become numb, or turn cold and blue.  Keep the splint or brace clean.  If the splint or brace is not waterproof: ? Do not let it get wet. ? Cover it with a watertight covering when you take a bath or shower. Managing pain, stiffness, and swelling  If directed, put ice on the affected area. ? If you have a removable splint or brace, remove it as told by your health care provider. ? Put ice in a plastic bag. ? Place a towel between your skin and the bag. ? Leave the ice on for 20 minutes, 2-3 times a day.  Move the fingers or toes of the affected limb often, if this applies. This can help to prevent stiffness and lessen swelling.  If directed, raise (elevate) the affected area above the level of your heart while you are sitting or lying down.   If directed, apply heat to the affected area before you exercise. Use the heat source that your health care provider recommends, such as a moist heat pack or a heating pad. ? Place a towel between your skin and the heat source. ? Leave the heat on for 20-30 minutes. ? Remove the heat if your skin turns bright red. This is especially important if you are unable to feel pain, heat, or cold. You may have a greater risk of getting burned.      Driving  Do not drive or use heavy machinery while taking prescription pain medicine.  Ask your health care provider when it is safe to drive if you have a splint or brace on any part of your arm or leg. Activity  Rest the affected area as told by your health care provider.  Return to your normal  activities as told by your health care provider. Ask your health care provider what activities are safe for you.  Avoid using the affected area while you are experiencing symptoms of tendinitis.  Do exercises as told by your health care provider. General instructions  If you have a splint, do not put pressure on any part of the splint until it is fully hardened. This may take several hours.  Wear an elastic bandage or compression wrap only as told by your health care provider.  Take over-the-counter and prescription medicines only as told by your health care provider.  Keep all follow-up visits as told by your health care provider. This is important. Contact a health care provider if:  Your symptoms do not improve.  You develop new, unexplained problems, such as numbness in your hands. Summary  Tendinitis is inflammation  of a tendon.  You are more likely to develop this condition if you do activities that involve the same movements over and over again.  This condition may be treated by resting, icing, applying pressure (compression), and elevating the area above the level of your heart. This is known as RICE therapy.  Avoid using the affected area while you are experiencing symptoms of tendinitis. This information is not intended to replace advice given to you by your health care provider. Make sure you discuss any questions you have with your health care provider. Document Revised: 02/16/2018 Document Reviewed: 12/30/2017 Elsevier Patient Education  [place].

## 2021-01-29 ENCOUNTER — Other Ambulatory Visit (HOSPITAL_COMMUNITY): Payer: Self-pay

## 2021-01-29 MED ORDER — CHLORPROMAZINE HCL 25 MG PO TABS
ORAL_TABLET | ORAL | 0 refills | Status: DC
  Filled 2021-01-29: qty 10, 2d supply, fill #0

## 2021-02-07 ENCOUNTER — Other Ambulatory Visit (HOSPITAL_COMMUNITY): Payer: Self-pay

## 2021-02-07 MED FILL — Benazepril HCl Tab 10 MG: ORAL | 90 days supply | Qty: 90 | Fill #0 | Status: AC

## 2021-02-08 ENCOUNTER — Other Ambulatory Visit (HOSPITAL_COMMUNITY): Payer: Self-pay

## 2021-02-18 ENCOUNTER — Encounter: Payer: Self-pay | Admitting: Internal Medicine

## 2021-02-18 ENCOUNTER — Telehealth (INDEPENDENT_AMBULATORY_CARE_PROVIDER_SITE_OTHER): Payer: No Typology Code available for payment source | Admitting: Internal Medicine

## 2021-02-18 VITALS — Temp 99.0°F

## 2021-02-18 DIAGNOSIS — U071 COVID-19: Secondary | ICD-10-CM | POA: Diagnosis not present

## 2021-02-18 DIAGNOSIS — J988 Other specified respiratory disorders: Secondary | ICD-10-CM | POA: Diagnosis not present

## 2021-02-18 NOTE — Progress Notes (Signed)
Virtual Visit via Video Note  I connected withNAME@ on 02/18/21 at  2:00 PM EDT by a video enabled telemedicine application and verified that I am speaking with the correct person using two identifiers. Location patient: home Location provider:work office Persons participating in the virtual visit: patient, provider  [place]th national recommendations  regarding COVID 19 pandemic   video visit is advised over in office visit for this patient.  Patient aware  of the limitations of evaluation and management by telemedicine and  availability of in person appointments. and agreed to proceed.   HPI: Sie Formisano presents for video visit PCP appt NA. Generally well except for hypertension immunized against COVID-19 x4 last booster about 4 months ago. After traveling to a meeting in [place] he developed this symptoms began yesterday fatigue stuffiness body aches and now a low-grade fever 99.6 minor cough no nausea vomiting unusual rash.  Took some cold and flu medicine. Had positive rapid home COVID test today. Lives with wife who is asymptomatic and is tested negative.   ROS: See pertinent positives and negatives per HPI.  Past Medical History:  Diagnosis Date   GERD (gastroesophageal reflux disease)    Hypertension     Past Surgical History:  Procedure Laterality Date   APPENDECTOMY     NASAL SEPTUM SURGERY     TONSILLECTOMY     UVULECTOMY      Family History  Problem Relation Age of Onset   Heart block Father    Cervical cancer Mother 7   High blood pressure Mother    Thyroid disease Mother    Lung cancer Brother    Heart attack Maternal Grandfather    Throat cancer Paternal Grandmother    Colon polyps Neg Hx    Colon cancer Neg Hx    Esophageal cancer Neg Hx    Rectal cancer Neg Hx    Stomach cancer Neg Hx     Social History   Tobacco Use   Smoking status: Never   Smokeless tobacco: Never  Vaping Use   Vaping Use: Never used  Substance Use Topics   Alcohol use:  Yes    Alcohol/week: 0.0 standard drinks    Comment: glass of wine 4 nights per week   Drug use: No      Current Outpatient Medications:    benazepril (LOTENSIN) 10 MG tablet, TAKE 1 TABLET BY MOUTH ONCE DAILY, Disp: 90 tablet, Rfl: 2   betamethasone dipropionate 0.05 % cream, Apply topically 2 (two) times daily., Disp: 30 g, Rfl: 1   chlorproMAZINE (THORAZINE) 25 MG tablet, Take 1 to 2 tablets every 6 to 8 hours for hiccups, Disp: 10 tablet, Rfl: 0   cyclobenzaprine (FLEXERIL) 5 MG tablet, Take 1 tablet (5 mg total) by mouth 3 (three) times daily as needed for muscle spasms., Disp: 30 tablet, Rfl: 0   Flaxseed, Linseed, (FLAX SEEDS PO), Take by mouth., Disp: , Rfl:    meloxicam (MOBIC) 7.5 MG tablet, Take 1 tablet (7.5 mg total) by mouth daily., Disp: 30 tablet, Rfl: 0   Multiple Vitamin (MULTIVITAMIN) capsule, Take 1 capsule by mouth daily., Disp: , Rfl:    omeprazole (PRILOSEC) 20 MG capsule, Take 1 capsule (20 mg total) by mouth every other day., Disp: , Rfl:    sildenafil (VIAGRA) 25 MG tablet, TAKE 1-3 TABLETS BY MOUTH AS NEEDED PRIOR TO SEXUAL INTERCOURSE. DO NOT USE MORE THEN 1 DOSE IN 24 HOURS, Disp: 6 tablet, Rfl: 1  EXAM: BP Readings from Last  3 Encounters:  01/18/21 (!) 140/92  04/04/20 118/78  11/04/19 (!) 124/94    VITALS per patient if applicable:  GENERAL: alert, oriented, appears well and in no acute distress looks mildly under the weather nontoxic normal speech no respiratory distress normal color  HEENT: atraumatic, conjunttiva clear, no obvious abnormalities on inspection of external nose and ears  NECK: normal movements of the head and neck  LUNGS: on inspection no signs of respiratory distress, breathing rate appears normal, no obvious gross SOB, gasping or wheezing  CV: no obvious cyanosis  MS: moves all visible extremities without noticeable abnormality  PSYCH/NEURO: pleasant and cooperative, speech and thought processing grossly intact Lab Results   Component Value Date   WBC 5.9 08/15/2020   HGB 15.0 08/15/2020   HCT 46.4 08/15/2020   PLT 255 08/15/2020   GLUCOSE 102 (H) 03/30/2020   CHOL 184 03/30/2020   TRIG 75 03/30/2020   HDL 47 03/30/2020   LDLCALC 120 (H) 03/30/2020   ALT 17 03/30/2020   AST 21 03/30/2020   NA 139 03/30/2020   K 4.5 03/30/2020   CL 103 03/30/2020   CREATININE 0.88 03/30/2020   BUN 14 03/30/2020   CO2 26 03/30/2020   PSA 0.9 03/30/2020   HGBA1C 5.7 (H) 03/30/2020    ASSESSMENT AND PLAN:  Discussed the following assessment and plan:    ICD-10-CM   1. Respiratory tract infection due to COVID-19 virus  U07.1    O29.4    Uncomplicated ,vaccinated x4     No current signs of complication vaccinated x4 modest low risk for complications 3 Expectant manage symptomatic treatment discussed isolation.  Recommended a new onset of such. He is post to participate in the [place] which will be day 9 from the onset of symptoms. Contact us with alarm symptoms or unexpected findings or concerns.  Counseled.   Expectant management and discussion of plan and treatment with opportunity to ask questions and all were answered. The patient agreed with the plan and demonstrated an understanding of the instructions.   Advised to call back or seek an in-person evaluation if worsening  or having  further concerns . Return if symptoms worsen or fail to improve as expected.   Shanon Ace, MD

## 2021-03-07 ENCOUNTER — Telehealth: Payer: Self-pay | Admitting: Family Medicine

## 2021-03-07 NOTE — Telephone Encounter (Signed)
Medical Evaluation of Fitness to Resume Scuba Diving form to be filled out--placed in dr's folder.  Call [phone] upon completion.

## 2021-03-11 NOTE — Telephone Encounter (Signed)
Spoke with the pt and informed him of the message below.  Appt scheduled for 7/20.

## 2021-03-13 ENCOUNTER — Ambulatory Visit (INDEPENDENT_AMBULATORY_CARE_PROVIDER_SITE_OTHER): Payer: No Typology Code available for payment source | Admitting: Family Medicine

## 2021-03-13 ENCOUNTER — Other Ambulatory Visit (HOSPITAL_COMMUNITY): Payer: Self-pay

## 2021-03-13 ENCOUNTER — Encounter: Payer: Self-pay | Admitting: Family Medicine

## 2021-03-13 ENCOUNTER — Other Ambulatory Visit: Payer: Self-pay | Admitting: Family Medicine

## 2021-03-13 ENCOUNTER — Other Ambulatory Visit: Payer: Self-pay

## 2021-03-13 VITALS — BP 110/80 | HR 69 | Temp 97.6°F | Ht 75.0 in | Wt 222.3 lb

## 2021-03-13 DIAGNOSIS — Y9315 Activity, underwater diving and snorkeling: Secondary | ICD-10-CM | POA: Diagnosis not present

## 2021-03-13 DIAGNOSIS — M25511 Pain in right shoulder: Secondary | ICD-10-CM

## 2021-03-13 DIAGNOSIS — K219 Gastro-esophageal reflux disease without esophagitis: Secondary | ICD-10-CM

## 2021-03-13 DIAGNOSIS — Z13220 Encounter for screening for lipoid disorders: Secondary | ICD-10-CM

## 2021-03-13 DIAGNOSIS — I10 Essential (primary) hypertension: Secondary | ICD-10-CM | POA: Diagnosis not present

## 2021-03-13 DIAGNOSIS — Z0000 Encounter for general adult medical examination without abnormal findings: Secondary | ICD-10-CM

## 2021-03-13 DIAGNOSIS — M7521 Bicipital tendinitis, right shoulder: Secondary | ICD-10-CM

## 2021-03-13 MED ORDER — MELOXICAM 7.5 MG PO TABS
7.5000 mg | ORAL_TABLET | Freq: Every day | ORAL | 1 refills | Status: DC | PRN
  Filled 2021-03-13: qty 90, 45d supply, fill #0
  Filled 2021-10-03: qty 90, 45d supply, fill #1

## 2021-03-13 MED ORDER — SILDENAFIL CITRATE 25 MG PO TABS
ORAL_TABLET | ORAL | 5 refills | Status: DC
  Filled 2021-03-13: qty 6, 30d supply, fill #0

## 2021-03-13 NOTE — Progress Notes (Signed)
Lawrence Wang DOB: [birth year] Encounter date: 03/13/2021  This is a 65 y.o. 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 He works at [employer] center.   Last visit was video visit with Dr. Regis Bill for resp tract infection; had COVID (got it at conference). Feels back to baseline. No residual symptoms or breathing issues.   HTN: currently on benazepril 10mg  GERD: omeprazole 20mg  taken daily; does good job at controlling sx. Hasn't been able to back off of medication; tends to get symptomatic.   Has been seeing ortho for right shoulder - had acute issue with shoulder that flared, but this has gone away. Just saw Dr. Tawny Asal on Monday for follow up; he is pretty stable. Not debilitating; not limiting ROM. Just periodically takes a little anti-inflammatory if it flares. Right before it flared was using exercise machine which may have flared it.   Cololonoscopy 10/2019.   Past Medical History:  Diagnosis Date   GERD (gastroesophageal reflux disease)    Hypertension    Past Surgical History:  Procedure Laterality Date   APPENDECTOMY     NASAL SEPTUM SURGERY     TONSILLECTOMY     UVULECTOMY     Allergies  Allergen Reactions   Peanuts [Peanut Oil] Other (See Comments)    Headaches   Zithromax [Azithromycin] Other (See Comments)    Hiccups-per patient   Current Meds  Medication Sig   benazepril (LOTENSIN) 10 MG tablet TAKE 1 TABLET BY MOUTH ONCE DAILY   betamethasone dipropionate 0.05 % cream Apply topically 2 (two) times daily.   Flaxseed, Linseed, (FLAX SEEDS PO) Take by mouth.   meloxicam (MOBIC) 7.5 MG tablet Take 1 tablet (7.5 mg total) by mouth daily.   Multiple Vitamin (MULTIVITAMIN) capsule Take 1 capsule by mouth daily.   omeprazole (PRILOSEC) 20 MG capsule Take 1 capsule (20 mg total) by mouth every other day. (Patient taking differently: Take 20 mg by mouth daily.)    sildenafil (VIAGRA) 25 MG tablet TAKE 1-3 TABLETS BY MOUTH AS NEEDED PRIOR TO SEXUAL INTERCOURSE. DO NOT USE MORE THEN 1 DOSE IN 24 HOURS   Social History   Tobacco Use   Smoking status: Never   Smokeless tobacco: Never  Substance Use Topics   Alcohol use: Yes    Alcohol/week: 0.0 standard drinks    Comment: glass of wine 4 nights per week   Family History  Problem Relation Age of Onset   Heart block Father    Cervical cancer Mother 36   High blood pressure Mother    Thyroid disease Mother    Lung cancer Brother    Heart attack Maternal Grandfather    Throat cancer Paternal Grandmother    Colon polyps Neg Hx    Colon cancer Neg Hx    Esophageal cancer Neg Hx    Rectal cancer Neg Hx    Stomach cancer Neg Hx      Review of Systems  Constitutional:  Negative for activity change, appetite change, chills, fatigue, fever and unexpected weight change.  HENT:  Negative for congestion, ear pain, hearing loss, sinus pressure, sinus pain, sore throat and trouble swallowing.   Eyes:  Negative for pain and visual disturbance.  Respiratory:  Negative for cough, chest tightness, shortness of breath and wheezing.   Cardiovascular:  Negative for chest pain, palpitations and leg swelling.  Gastrointestinal:  Negative for abdominal distention, abdominal pain, blood in stool, constipation,  diarrhea, nausea and vomiting.  Genitourinary:  Negative for decreased urine volume, difficulty urinating, dysuria, penile pain and testicular pain.  Musculoskeletal:  Negative for arthralgias, back pain and joint swelling.  Skin:  Negative for rash.  Neurological:  Negative for dizziness, weakness, numbness and headaches.  Hematological:  Negative for adenopathy. Does not bruise/bleed easily.  Psychiatric/Behavioral:  Negative for agitation, sleep disturbance and suicidal ideas. The patient is not nervous/anxious.    CBC:  Lab Results  Component Value Date   WBC 5.9 08/15/2020   HGB 15.0 08/15/2020    HCT 46.4 08/15/2020   MCH 27.5 08/15/2020   MCHC 32.3 08/15/2020   RDW 13.6 08/15/2020   PLT 255 08/15/2020   MPV 9.5 08/15/2020   CMP: Lab Results  Component Value Date   NA 139 03/30/2020   K 4.5 03/30/2020   CL 103 03/30/2020   CO2 26 03/30/2020   GLUCOSE 102 (H) 03/30/2020   BUN 14 03/30/2020   CREATININE 0.88 03/30/2020   CALCIUM 9.2 03/30/2020   PROT 6.4 03/30/2020   BILITOT 0.6 03/30/2020   ALKPHOS 15 (L) 11/11/2014   ALT 17 03/30/2020   AST 21 03/30/2020   LIPID: Lab Results  Component Value Date   CHOL 184 03/30/2020   TRIG 75 03/30/2020   HDL 47 03/30/2020   LDLCALC 120 (H) 03/30/2020    Objective:  BP 110/80 (BP Location: Left Arm, Patient Position: Sitting, Cuff Size: Large)   Pulse 69   Temp 97.6 F (36.4 C) (Oral)   Ht 6\' 3"  (1.905 m)   Wt 222 lb 4.8 oz (100.8 kg)   SpO2 98%   BMI 27.79 kg/m   Weight: 222 lb 4.8 oz (100.8 kg)   BP Readings from Last 3 Encounters:  03/13/21 110/80  01/18/21 (!) 140/92  04/04/20 118/78   Wt Readings from Last 3 Encounters:  03/13/21 222 lb 4.8 oz (100.8 kg)  01/18/21 220 lb 9.6 oz (100.1 kg)  04/04/20 226 lb 4.8 oz (102.6 kg)    Physical Exam Constitutional:      General: He is not in acute distress.    Appearance: He is well-developed.  HENT:     Head: Normocephalic and atraumatic.     Right Ear: External ear normal.     Left Ear: External ear normal.     Nose: Nose normal.     Mouth/Throat:     Pharynx: No oropharyngeal exudate.  Eyes:     Conjunctiva/sclera: Conjunctivae normal.     Pupils: Pupils are equal, round, and reactive to light.  Neck:     Thyroid: No thyromegaly.  Cardiovascular:     Rate and Rhythm: Normal rate and regular rhythm.     Heart sounds: Normal heart sounds. No murmur heard.   No friction rub. No gallop.  Pulmonary:     Effort: Pulmonary effort is normal. No respiratory distress.     Breath sounds: Normal breath sounds. No stridor. No wheezing or rales.  Abdominal:      General: Bowel sounds are normal.     Palpations: Abdomen is soft.  Musculoskeletal:        General: Normal range of motion.     Cervical back: Neck supple.  Skin:    General: Skin is warm and dry.  Neurological:     Mental Status: He is alert and oriented to person, place, and time.  Psychiatric:        Behavior: Behavior normal.  Thought Content: Thought content normal.        Judgment: Judgment normal.    Assessment/Plan: Health Maintenance Due  Topic Date Due   Zoster Vaccines- Shingrix (1 of 2) Never done   COVID-19 Vaccine (3 - Booster for Pfizer series) 05/01/2020   Health Maintenance reviewed.  1. Preventative health care Keep up with healthy lifestyle, regular activity.   2. Primary hypertension Well controlled with benazepril 10mg  daily. - CBC with Differential/Platelet; Future - Comprehensive metabolic panel; Future  3. Gastroesophageal reflux disease, unspecified whether esophagitis present Well controlled with omeprazole daily.   4. Activities involving scuba diving Paperwork for scuba completed today. Copy scanned into chart.   5. Lipid screening - Lipid panel; Future  Return in about 6 months (around 09/13/2021) for Chronic condition visit; bloodwork prior to visit.  Micheline Rough, MD

## 2021-03-14 ENCOUNTER — Other Ambulatory Visit (HOSPITAL_COMMUNITY): Payer: Self-pay

## 2021-03-14 ENCOUNTER — Other Ambulatory Visit: Payer: Self-pay | Admitting: Family Medicine

## 2021-03-14 DIAGNOSIS — L309 Dermatitis, unspecified: Secondary | ICD-10-CM

## 2021-03-14 MED ORDER — BETAMETHASONE DIPROPIONATE 0.05 % EX CREA
TOPICAL_CREAM | Freq: Two times a day (BID) | CUTANEOUS | 1 refills | Status: DC
  Filled 2021-03-14: qty 30, 10d supply, fill #0

## 2021-03-18 ENCOUNTER — Other Ambulatory Visit (HOSPITAL_COMMUNITY): Payer: Self-pay

## 2021-04-04 ENCOUNTER — Telehealth: Payer: Self-pay | Admitting: Family Medicine

## 2021-04-04 DIAGNOSIS — H539 Unspecified visual disturbance: Secondary | ICD-10-CM

## 2021-04-04 NOTE — Telephone Encounter (Signed)
Pt would like another referral to see [facility]  for - Vision abnormalities and floates. Old one expired

## 2021-04-05 NOTE — Telephone Encounter (Signed)
ok 

## 2021-04-05 NOTE — Telephone Encounter (Signed)
Left a detailed message at the patients cell number stating the referral was placed and someone will call with appt info.

## 2021-04-30 ENCOUNTER — Other Ambulatory Visit (HOSPITAL_COMMUNITY): Payer: Self-pay

## 2021-04-30 MED FILL — Benazepril HCl Tab 10 MG: ORAL | 90 days supply | Qty: 90 | Fill #1 | Status: AC

## 2021-05-08 ENCOUNTER — Ambulatory Visit: Payer: Self-pay | Admitting: Sports Medicine

## 2021-05-09 ENCOUNTER — Encounter: Payer: Self-pay | Admitting: Sports Medicine

## 2021-05-09 ENCOUNTER — Other Ambulatory Visit: Payer: Self-pay

## 2021-05-09 ENCOUNTER — Ambulatory Visit
Admission: RE | Admit: 2021-05-09 | Discharge: 2021-05-09 | Disposition: A | Discharge status: Home / Self Care | Payer: No Typology Code available for payment source | Source: Ambulatory Visit | Attending: Sports Medicine | Admitting: Sports Medicine

## 2021-05-09 ENCOUNTER — Ambulatory Visit (INDEPENDENT_AMBULATORY_CARE_PROVIDER_SITE_OTHER): Payer: No Typology Code available for payment source | Admitting: Sports Medicine

## 2021-05-09 VITALS — BP 132/70 | Ht 75.0 in | Wt 215.0 lb

## 2021-05-09 DIAGNOSIS — Y9315 Activity, underwater diving and snorkeling: Secondary | ICD-10-CM | POA: Diagnosis not present

## 2021-05-09 DIAGNOSIS — Z0189 Encounter for other specified special examinations: Secondary | ICD-10-CM

## 2021-05-10 ENCOUNTER — Encounter: Payer: Self-pay | Admitting: Sports Medicine

## 2021-05-10 NOTE — Progress Notes (Signed)
   Subjective:    Patient ID: Lawrence Wang, male    DOB: 03-09-1956, 65 y.o.   MRN: [id number]  HPI chief complaint: Presents today for a dive physical  Lawrence Wang is a relatively healthy 65 year old male that presents today for a dive physical on behalf of the [facility].  He has been diving since a teenager.  He has never had any difficulty with this.  His medical history is significant only for hypertension which is well controlled on medication.  He also wears hearing aids.  He has no complaints today.  Past medical history reviewed Medications reviewed Allergies reviewed   Review of Systems As above    Objective:   Physical Exam  Well-developed, well-nourished.  No acute distress.  Blood pressure 132/70  HEENT: Tympanic membranes intact and clear bilaterally Cardiovascular: Regular rate.  No murmurs, rubs, or gallops. Lungs: Clear to auscultation.  No rhonchi, rales, or wheezes. Extremities: No edema  Chest x-ray is obtained and compared to chest x-ray on May 16, 2019.  No active cardiopulmonary disease.  EKG shows a regular rate and rhythm.  Normal axis and normal deviation.  No signs of ischemia or infarction.  It is a normal EKG.  Urinalysis from May 08, 2020 was within normal limits.  Glucose was 102 in August 2021.  Most recent hemoglobin from December 2021 was 15.       Assessment & Plan:   Healthy 65 year old male  Appropriate paperwork was completed and returned to the patient.  A copy is available for review in epic.  He is cleared to dive without restriction.  Follow-up with me as needed.

## 2021-05-30 ENCOUNTER — Encounter: Payer: Self-pay | Admitting: Sports Medicine

## 2021-06-28 ENCOUNTER — Other Ambulatory Visit: Payer: Self-pay

## 2021-06-28 ENCOUNTER — Ambulatory Visit (INDEPENDENT_AMBULATORY_CARE_PROVIDER_SITE_OTHER): Payer: No Typology Code available for payment source | Admitting: Adult Health

## 2021-06-28 VITALS — BP 130/80 | HR 62 | Temp 98.1°F | Ht 75.0 in | Wt 221.0 lb

## 2021-06-28 DIAGNOSIS — K4090 Unilateral inguinal hernia, without obstruction or gangrene, not specified as recurrent: Secondary | ICD-10-CM

## 2021-06-28 NOTE — Patient Instructions (Signed)
It was great meeting you today   You have an inguinal hernia.   I have referred you to general surgery for further evaluation. They will call you to schedule

## 2021-06-28 NOTE — Progress Notes (Signed)
Subjective:    Patient ID: Lawrence Wang, male    DOB: 10-11-1955, 65 y.o.   MRN: [id number]  HPI 65 year old male who  has a past medical history of GERD (gastroesophageal reflux disease) and Hypertension.  He is a patient of Dr. Ethlyn Gallery who I am seeing today for an acute issue of left sided groin pain on and off for the last week. Pain is worse when changing positions such as getting up out of a chair or getting out of car.   No issues with urination or bowel movements.   Review of Systems See HPI   Past Medical History:  Diagnosis Date   GERD (gastroesophageal reflux disease)    Hypertension     Social History   Socioeconomic History   Marital status: Married    Spouse name: Not on file   Number of children: Not on file   Years of education: Not on file   Highest education level: Master's degree (e.g., MA, MS, MEng, MEd, MSW, MBA)  Occupational History   Not on file  Tobacco Use   Smoking status: Never   Smokeless tobacco: Never  Vaping Use   Vaping Use: Never used  Substance and Sexual Activity   Alcohol use: Yes    Alcohol/week: 0.0 standard drinks    Comment: glass of wine 4 nights per week   Drug use: No   Sexual activity: Not on file  Other Topics Concern   Not on file  Social History Narrative   Work or School: Engineer, site, 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      Home Situation: lives with wife      Spiritual Beliefs: protestant      Lifestyle: regular exercise, diet not great      Social Determinants of Radio broadcast assistant Strain: Low Risk    Difficulty of Paying Living Expenses: Not hard at all  Food Insecurity: No Food Insecurity   Worried About Charity fundraiser in the Last Year: Never true   Arboriculturist in the Last Year: Never true  Transportation Needs: No Transportation Needs   Lack of Transportation (Medical): No   Lack of Transportation (Non-Medical): No  Physical Activity:  Insufficiently Active   Days of Exercise per Week: 3 days   Minutes of Exercise per Session: 30 min  Stress: No Stress Concern Present   Feeling of Stress : Only a little  Social Connections: Engineer, building services of Communication with Friends and Family: Three times a week   Frequency of Social Gatherings with Friends and Family: Once a week   Attends Religious Services: More than 4 times per year   Active Member of [employer] or Organizations: Yes   Attends Music therapist: More than 4 times per year   Marital Status: Married  Human resources officer Violence: Not on file    Past Surgical History:  Procedure Laterality Date   APPENDECTOMY     NASAL SEPTUM SURGERY     TONSILLECTOMY     UVULECTOMY      Family History  Problem Relation Age of Onset   Heart block Father    Cervical cancer Mother 35   High blood pressure Mother    Thyroid disease Mother    Lung cancer Brother    Heart attack Maternal Grandfather    Throat cancer Paternal Grandmother    Colon polyps Neg Hx    Colon cancer  Neg Hx    Esophageal cancer Neg Hx    Rectal cancer Neg Hx    Stomach cancer Neg Hx     Allergies  Allergen Reactions   Peanuts [Peanut Oil] Other (See Comments)    Headaches   Zithromax [Azithromycin] Other (See Comments)    Hiccups-per patient    Current Outpatient Medications on File Prior to Visit  Medication Sig Dispense Refill   benazepril (LOTENSIN) 10 MG tablet TAKE 1 TABLET BY MOUTH ONCE DAILY 90 tablet 2   betamethasone dipropionate 0.05 % cream Apply to affected area 2 times daily. 30 g 1   Flaxseed, Linseed, (FLAX SEEDS PO) Take by mouth.     meloxicam (MOBIC) 7.5 MG tablet Take 1-2 tablets (7.5-15 mg total) by mouth daily as needed for pain. 90 tablet 1   Multiple Vitamin (MULTIVITAMIN) capsule Take 1 capsule by mouth daily.     omeprazole (PRILOSEC) 20 MG capsule Take 1 capsule (20 mg total) by mouth every other day. (Patient taking differently: Take 20 mg  by mouth daily.)     sildenafil (VIAGRA) 25 MG tablet TAKE 1-3 TABLETS BY MOUTH AS NEEDED PRIOR TO SEXUAL INTERCOURSE. DO NOT USE MORE THEN 1 DOSE IN 24 HOURS 6 tablet 5   No current facility-administered medications on file prior to visit.    BP 130/80   Pulse 62   Temp 98.1 F (36.7 C) (Oral)   Ht 6\' 3"  (1.905 m)   Wt 221 lb (100.2 kg)   SpO2 99%   BMI 27.62 kg/m       Objective:   Physical Exam Vitals and nursing note reviewed.  Constitutional:      Appearance: Normal appearance.  Abdominal:     General: Abdomen is flat. Bowel sounds are normal.     Palpations: Abdomen is soft.     Hernia: A hernia is present. Hernia is present in the left inguinal area.     Comments: Easily reducible left inguinal hernia   Musculoskeletal:        General: Normal range of motion.  Skin:    General: Skin is warm and dry.     Capillary Refill: Capillary refill takes less than 2 seconds.  Neurological:     General: No focal deficit present.     Mental Status: He is alert and oriented to person, place, and time.  Psychiatric:        Mood and Affect: Mood normal.        Behavior: Behavior normal.        Thought Content: Thought content normal.        Judgment: Judgment normal.      Assessment & Plan:  1. Non-recurrent unilateral inguinal hernia without obstruction or gangrene - Red flags reviewed.  - Ambulatory referral to General Surgery - Follow up as needed   Dorothyann Peng, NP

## 2021-07-03 ENCOUNTER — Encounter: Payer: Self-pay | Admitting: Adult Health

## 2021-07-28 ENCOUNTER — Other Ambulatory Visit: Payer: Self-pay | Admitting: Family Medicine

## 2021-07-29 ENCOUNTER — Other Ambulatory Visit (HOSPITAL_COMMUNITY): Payer: Self-pay

## 2021-07-29 MED ORDER — BENAZEPRIL HCL 10 MG PO TABS
ORAL_TABLET | Freq: Every day | ORAL | 2 refills | Status: DC
  Filled 2021-07-29: qty 90, 90d supply, fill #0
  Filled 2021-11-05: qty 90, 90d supply, fill #1
  Filled 2022-02-11: qty 90, 90d supply, fill #2

## 2021-08-26 ENCOUNTER — Encounter: Payer: Self-pay | Admitting: Family Medicine

## 2021-09-02 ENCOUNTER — Other Ambulatory Visit (HOSPITAL_COMMUNITY): Payer: Self-pay

## 2021-09-02 ENCOUNTER — Ambulatory Visit (INDEPENDENT_AMBULATORY_CARE_PROVIDER_SITE_OTHER): Payer: No Typology Code available for payment source | Admitting: Family Medicine

## 2021-09-02 ENCOUNTER — Encounter: Payer: Self-pay | Admitting: Family Medicine

## 2021-09-02 VITALS — BP 126/82 | HR 73 | Temp 97.8°F | Ht 75.0 in | Wt 231.8 lb

## 2021-09-02 DIAGNOSIS — L309 Dermatitis, unspecified: Secondary | ICD-10-CM | POA: Diagnosis not present

## 2021-09-02 DIAGNOSIS — I10 Essential (primary) hypertension: Secondary | ICD-10-CM | POA: Diagnosis not present

## 2021-09-02 DIAGNOSIS — Z13220 Encounter for screening for lipoid disorders: Secondary | ICD-10-CM | POA: Diagnosis not present

## 2021-09-02 LAB — LIPID PANEL
Cholesterol: 232 mg/dL — ABNORMAL HIGH (ref 0–200)
HDL: 49.8 mg/dL (ref >39.00)
LDL Cholesterol: 154 mg/dL — ABNORMAL HIGH (ref 0–99)
NonHDL: 182.51
Total CHOL/HDL Ratio: 5
Triglycerides: 143 mg/dL (ref 0.0–149.0)
VLDL: 28.6 mg/dL (ref 0.0–40.0)

## 2021-09-02 LAB — CBC WITH DIFFERENTIAL/PLATELET
Basophils Absolute: 0 10*3/uL (ref 0.0–0.1)
Basophils Relative: 0.4 % (ref 0.0–3.0)
Eosinophils Absolute: 0.2 10*3/uL (ref 0.0–0.7)
Eosinophils Relative: 3.7 % (ref 0.0–5.0)
HCT: 47.2 % (ref 39.0–52.0)
Hemoglobin: 15.6 g/dL (ref 13.0–17.0)
Lymphocytes Relative: 31.9 % (ref 12.0–46.0)
Lymphs Abs: 1.6 10*3/uL (ref 0.7–4.0)
MCHC: 33.1 g/dL (ref 30.0–36.0)
MCV: 89.3 fl (ref 78.0–100.0)
Monocytes Absolute: 0.5 10*3/uL (ref 0.1–1.0)
Monocytes Relative: 10.1 % (ref 3.0–12.0)
Neutro Abs: 2.8 10*3/uL (ref 1.4–7.7)
Neutrophils Relative %: 53.9 % (ref 43.0–77.0)
Platelets: 211 10*3/uL (ref 150.0–400.0)
RBC: 5.29 Mil/uL (ref 4.22–5.81)
RDW: 13.3 % (ref 11.5–15.5)
WBC: 5.1 10*3/uL (ref 4.0–10.5)

## 2021-09-02 LAB — COMPREHENSIVE METABOLIC PANEL
ALT: 19 U/L (ref 0–53)
AST: 20 U/L (ref 0–37)
Albumin: 4.5 g/dL (ref 3.5–5.2)
Alkaline Phosphatase: 14 U/L — ABNORMAL LOW (ref 39–117)
BUN: 12 mg/dL (ref 6–23)
CO2: 32 mEq/L (ref 19–32)
Calcium: 9.5 mg/dL (ref 8.4–10.5)
Chloride: 100 mEq/L (ref 96–112)
Creatinine, Ser: 0.82 mg/dL (ref 0.40–1.50)
GFR: 93.34 mL/min (ref >60.00)
Glucose, Bld: 90 mg/dL (ref 70–99)
Potassium: 4.1 mEq/L (ref 3.5–5.1)
Sodium: 138 mEq/L (ref 135–145)
Total Bilirubin: 0.8 mg/dL (ref 0.2–1.2)
Total Protein: 7.1 g/dL (ref 6.0–8.3)

## 2021-09-02 LAB — SEDIMENTATION RATE: Sed Rate: 3 mm/hr (ref 0–20)

## 2021-09-02 MED ORDER — BETAMETHASONE DIPROPIONATE 0.05 % EX CREA
TOPICAL_CREAM | Freq: Two times a day (BID) | CUTANEOUS | 1 refills | Status: DC
Start: 2021-09-02 — End: 2022-05-16
  Filled 2021-09-02: qty 75, 12d supply, fill #0
  Filled 2021-09-02: qty 15, 2d supply, fill #0

## 2021-09-02 NOTE — Progress Notes (Signed)
Lawrence Wang DOB: 28-Sep-1955 Encounter date: 09/02/2021  This is a 66 y.o. male who presents with Chief Complaint  Patient presents with   Rash    Patient complains of redness and questionable infection bilateral LE x2 years, no known injury, states he initially suspected areas were due to dryness, noticed same area of redness right hand x1 week   Patient requests to have labs today instead of Thursday    History of present illness:  Last time we talked about legs was 2 years ago, but issue has not fully gone away. He did seem to get improvement with betamethasone. He even saw dermatology and they told him they would recommend same treatment. He had response with using the betamethasone in addition to some occlusive dressing/moisturizing.   Has slathered legs with lotion, used the steroid cream that we gave him, but doesn't seem to make it go away. Initially was just on left leg. Has noted it getting worse in last 6 months; worse in winter. Thinks that right leg started around that time too. Hasn't done occlusive dressing recently.  It itches, but not painful unless it cracks. Itches less if kept moisturized.   Small spot on right hand. Not sure if related.   Will be having bilat inguinal hernia surgery this month. Wanted to make sure no infection present as he headed into that.   Takes mobic for shoulder pain when it flares up; does help with pain. Hasn't flared up as much as it did that initial time, but just with regular use.   Allergies  Allergen Reactions   Peanuts [Peanut Oil] Other (See Comments)    Headaches   Zithromax [Azithromycin] Other (See Comments)    Hiccups-per patient   Current Meds  Medication Sig   benazepril (LOTENSIN) 10 MG tablet TAKE 1 TABLET BY MOUTH ONCE DAILY   Flaxseed, Linseed, (FLAX SEEDS PO) Take by mouth.   meloxicam (MOBIC) 7.5 MG tablet Take 1-2 tablets (7.5-15 mg total) by mouth daily as needed for pain.   Multiple Vitamin (MULTIVITAMIN)  capsule Take 1 capsule by mouth daily.   omeprazole (PRILOSEC) 20 MG capsule Take 1 capsule (20 mg total) by mouth every other day. (Patient taking differently: Take 20 mg by mouth daily.)   sildenafil (VIAGRA) 25 MG tablet TAKE 1-3 TABLETS BY MOUTH AS NEEDED PRIOR TO SEXUAL INTERCOURSE. DO NOT USE MORE THEN 1 DOSE IN 24 HOURS   [DISCONTINUED] betamethasone dipropionate 0.05 % cream Apply to affected area 2 times daily.    Review of Systems  Constitutional:  Negative for chills, fatigue and fever.  Respiratory:  Negative for cough, chest tightness, shortness of breath and wheezing.   Cardiovascular:  Negative for chest pain, palpitations and leg swelling.   Objective:  BP 128/90 (BP Location: Left Arm, Patient Position: Sitting, Cuff Size: Large)    Pulse 73    Temp 97.8 F (36.6 C) (Oral)    Ht 6\' 3"  (1.905 m)    Wt 231 lb 12.8 oz (105.1 kg)    SpO2 97%    BMI 28.97 kg/m   Weight: 231 lb 12.8 oz (105.1 kg)   BP Readings from Last 3 Encounters:  09/02/21 128/90  06/28/21 130/80  05/09/21 132/70   Wt Readings from Last 3 Encounters:  09/02/21 231 lb 12.8 oz (105.1 kg)  06/28/21 221 lb (100.2 kg)  05/09/21 215 lb (97.5 kg)    Physical Exam Constitutional:      General: He is not in acute  distress.    Appearance: He is well-developed.  Cardiovascular:     Rate and Rhythm: Normal rate and regular rhythm.     Heart sounds: Normal heart sounds. No murmur heard.   No friction rub.  Pulmonary:     Effort: Pulmonary effort is normal. No respiratory distress.     Breath sounds: Normal breath sounds. No wheezing or rales.  Musculoskeletal:     Right lower leg: No edema.     Left lower leg: No edema.  Skin:    Comments: Blanching, well demarcated erythematous macular rash with scaling anterior bilat lower extremities.   Neurological:     Mental Status: He is alert and oriented to person, place, and time.  Psychiatric:        Behavior: Behavior normal.   Biopsy: After verbal  consent obtained, left anterior leg was cleansed with chloraprep and 1cc of lido with epi was injected on edge of newer aspect of rash. Using dermablade, 0.5cm sample of epidermis/dermis was removed. Hemostasis achieved with aluminum chloride. Sterile bandage applied with antibiotic ointment. Patient tolerated procedure well.   Assessment/Plan  1. Dermatitis May need to follow up with derm. Condition is steroid responsive, but has worsened since initial presentation. Unclear etiology. Discussed returning to occlusive dressing with betamethasone/aquaphor to help re-moisturize. Will check in once I get pathreport. - Sedimentation rate; Future - ANA; Future - betamethasone dipropionate 0.05 % cream; Apply to affected area 2 times daily.  Dispense: 90 g; Refill: 1 - ANA - Sedimentation rate - Dermatology pathology  2. Primary hypertension Bp well controlled. Continue with current medication. - CBC with Differential/Platelet; Future - Comprehensive metabolic panel; Future - Comprehensive metabolic panel - CBC with Differential/Platelet  3. Lipid screening - Lipid panel; Future - Lipid panel  Return if symptoms worsen or fail to improve.     Micheline Rough, MD

## 2021-09-03 ENCOUNTER — Other Ambulatory Visit (HOSPITAL_COMMUNITY): Payer: Self-pay

## 2021-09-04 LAB — ANA: Anti Nuclear Antibody (ANA): NEGATIVE

## 2021-09-05 ENCOUNTER — Other Ambulatory Visit: Payer: No Typology Code available for payment source

## 2021-09-13 ENCOUNTER — Encounter: Payer: Self-pay | Admitting: Family Medicine

## 2021-09-13 ENCOUNTER — Ambulatory Visit (INDEPENDENT_AMBULATORY_CARE_PROVIDER_SITE_OTHER): Payer: No Typology Code available for payment source | Admitting: Family Medicine

## 2021-09-13 ENCOUNTER — Other Ambulatory Visit (HOSPITAL_COMMUNITY): Payer: Self-pay

## 2021-09-13 VITALS — BP 120/80 | HR 90 | Temp 97.9°F | Ht 75.0 in | Wt 227.1 lb

## 2021-09-13 DIAGNOSIS — K219 Gastro-esophageal reflux disease without esophagitis: Secondary | ICD-10-CM

## 2021-09-13 DIAGNOSIS — E785 Hyperlipidemia, unspecified: Secondary | ICD-10-CM

## 2021-09-13 DIAGNOSIS — I10 Essential (primary) hypertension: Secondary | ICD-10-CM | POA: Diagnosis not present

## 2021-09-13 DIAGNOSIS — L409 Psoriasis, unspecified: Secondary | ICD-10-CM | POA: Diagnosis not present

## 2021-09-13 MED ORDER — TRIAMCINOLONE ACETONIDE 0.5 % EX OINT
1.0000 "application " | TOPICAL_OINTMENT | Freq: Two times a day (BID) | CUTANEOUS | 2 refills | Status: DC | PRN
  Filled 2021-09-13: qty 30, 15d supply, fill #0

## 2021-09-13 NOTE — Patient Instructions (Signed)
Original eucerin (less greasy) (red lid, white tub) or aquaphor for baseline moisture, triamcinolone if starting to scale; betamethasone if more severe redness/scaling.

## 2021-09-13 NOTE — Progress Notes (Signed)
Lawrence Wang DOB: July 24, 1956 Encounter date: 09/13/2021  This is a 66 y.o. male who presents with Chief Complaint  Patient presents with   Follow-up    History of present illness: Legs are looking much better  Pain in right shoulder - bothers him regularly, but most of time doesn't bother him. but per ortho he states not bad enough for surgery now. Not had anything as bad as original flare up when he couldn't lift arm. Uses meloxicam every 3 days or so to help.   Having bilat inguinal hernia surgery on Monday. Pain comes and goes. Not had severe pain that he had at beginning. Has changed some behaviors to help (ie weight belt around waist). At one point pain was severe.   HTN: benazepril 10; good about taking meds.   Ascvd score is 11%   Allergies  Allergen Reactions   Peanuts [Peanut Oil] Other (See Comments)    Headaches   Zithromax [Azithromycin] Other (See Comments)    Hiccups-per patient   Current Meds  Medication Sig   benazepril (LOTENSIN) 10 MG tablet TAKE 1 TABLET BY MOUTH ONCE DAILY   betamethasone dipropionate 0.05 % cream Apply to affected area 2 times daily.   Flaxseed, Linseed, (FLAX SEEDS PO) Take 1 capsule by mouth daily.   meloxicam (MOBIC) 7.5 MG tablet Take 1-2 tablets (7.5-15 mg total) by mouth daily as needed for pain.   Multiple Vitamin (MULTIVITAMIN) capsule Take 1 capsule by mouth daily.   omeprazole (PRILOSEC) 20 MG capsule Take 1 capsule (20 mg total) by mouth every other day. (Patient taking differently: Take 20 mg by mouth daily.)   sildenafil (VIAGRA) 25 MG tablet TAKE 1-3 TABLETS BY MOUTH AS NEEDED PRIOR TO SEXUAL INTERCOURSE. DO NOT USE MORE THEN 1 DOSE IN 24 HOURS   triamcinolone ointment (KENALOG) 0.5 % Apply to affected areas 2 (two) times daily as needed.    Review of Systems  Constitutional:  Negative for chills, fatigue and fever.  Respiratory:  Negative for cough, chest tightness, shortness of breath and wheezing.   Cardiovascular:   Negative for chest pain, palpitations and leg swelling.  Gastrointestinal:        Bilat inguinal hernias; intermittent pain   Musculoskeletal:  Positive for arthralgias (right shoulder).   Objective:  BP 120/80    Pulse 90    Temp 97.9 F (36.6 C) (Oral)    Ht 6\' 3"  (1.905 m)    Wt 227 lb 1.6 oz (103 kg)    SpO2 98%    BMI 28.39 kg/m   Weight: 227 lb 1.6 oz (103 kg)   BP Readings from Last 3 Encounters:  09/13/21 120/80  09/02/21 126/82  06/28/21 130/80   Wt Readings from Last 3 Encounters:  09/13/21 227 lb 1.6 oz (103 kg)  09/02/21 231 lb 12.8 oz (105.1 kg)  06/28/21 221 lb (100.2 kg)    Physical Exam Constitutional:      General: He is not in acute distress.    Appearance: He is well-developed.  Cardiovascular:     Rate and Rhythm: Normal rate and regular rhythm.     Heart sounds: Normal heart sounds. No murmur heard.   No friction rub.  Pulmonary:     Effort: Pulmonary effort is normal. No respiratory distress.     Breath sounds: Normal breath sounds. No wheezing or rales.  Musculoskeletal:     Right lower leg: No edema.     Left lower leg: No edema.  Neurological:  Mental Status: He is alert and oriented to person, place, and time.  Psychiatric:        Behavior: Behavior normal.     Assessment/Plan  1. Psoriasis Patient's legs are significantly better than the last time he was in the office.  He has been using occlusive dressings and this has really completely resolved the problem.  We discussed limiting use of his stronger steroid cream (betamethasone) and trying to maintain skin with good hydration and if needed, lower strength triamcinolone to help control flares.  He seems to respond very well to the occlusive dressings when needed.  We discussed limiting steroid creams due to long-term side effects of thinning of the skin.  He does not feel that he needs to follow-up with dermatology since his rash has completely resolved.  If he starts to have more areas  of psoriasis or flares that are difficult to control, he can follow back up with them. - triamcinolone ointment (KENALOG) 0.5 %; Apply to affected areas 2 (two) times daily as needed.  Dispense: 30 g; Refill: 2  2. Primary hypertension Blood pressures well controlled.  It was low on his initial check in the office today I encouraged him to check at home on occasion.  After his hernia surgery, he plans to get back to more regular exercise and would like to lose some weight.  We discussed that this will help benefit his blood pressure and we can continue to evaluate to see if are able to decrease blood pressure medications.  3. Gastroesophageal reflux disease, unspecified whether esophagitis present Continue with omeprazole 20 mg daily as needed.  4. Hyperlipidemia, unspecified hyperlipidemia type We discussed cardiovascular risk scoring today.  It would be recommended for him to start on a statin, but the benefit for him with rescoring is relatively small.  We discussed getting coronary calcium screening and discussed working on healthier eating and exercise.  He would like to work on lifestyle changes and plan to recheck cholesterol in a few months time.  If he is not able to make some good improvements with numbers, we can consider coronary calcium scoring to help him with decision of starting statin.  He is not completely opposed, but prefers to limit medications were possible. - NMR, lipoprofile  Return in about 3 months (around 12/12/2021) for labwork.      Micheline Rough, MD

## 2021-09-17 NOTE — Pre-Procedure Instructions (Signed)
Surgical Instructions    Your procedure is scheduled on Monday 09/23/21.   Report to [facility] Main Entrance "A" at 05:30 A.M., then check in with the Admitting office.  Call this number if you have problems the morning of surgery:  [phone]   If you have any questions prior to your surgery date call [phone]: Open Monday-Friday 8am-4pm    Remember:  Do not eat after midnight the night before your surgery  You may drink clear liquids until 04:30 A.M. the morning of your surgery.   Clear liquids allowed are: Water, Non-Citrus Juices (without pulp), Carbonated Beverages, Clear Tea, Black Coffee ONLY (NO MILK, CREAM OR POWDERED CREAMER of any kind), and Gatorade    Take these medicines the morning of surgery with A SIP OF WATER   omeprazole (PRILOSEC)   As of today, STOP taking any Aspirin (unless otherwise instructed by your surgeon) meloxicam (MOBIC), Aleve, Naproxen, Ibuprofen, Motrin, Advil, Goody's, BC's, all herbal medications, fish oil, and all vitamins.  After your COVID test   You are not required to quarantine however you are required to wear a well-fitting mask when you are out and around people not in your household.  If your mask becomes wet or soiled, replace with a new one.  Wash your hands often with soap and water for 20 seconds or clean your hands with an alcohol-based hand sanitizer that contains at least 60% alcohol.  Do not share personal items.  Notify your provider: if you are in close contact with someone who has COVID  or if you develop a fever of 100.4 or greater, sneezing, cough, sore throat, shortness of breath or body aches.           Do not wear jewelry or makeup Do not wear lotions, powders, perfumes/colognes, or deodorant. Do not shave 48 hours prior to surgery.  Men may shave face and neck. Do not bring valuables to the hospital. DO Not wear nail polish, gel polish, artificial nails, or any other type of covering on natural nails (fingers  and toes) If you have artificial nails or gel coating that need to be removed by a nail salon, please have this removed prior to surgery. Artificial nails or gel coating may interfere with anesthesia's ability to adequately monitor your vital signs.             [facility] is not responsible for any belongings or valuables.  Do NOT Smoke (Tobacco/Vaping)  24 hours prior to your procedure  If you use a CPAP at night, you may bring your mask for your overnight stay.   Contacts, glasses, hearing aids, dentures or partials may not be worn into surgery, please bring cases for these belongings   For patients admitted to the hospital, discharge time will be determined by your treatment team.   Patients discharged the day of surgery will not be allowed to drive home, and someone needs to stay with them for 24 hours.  NO VISITORS WILL BE ALLOWED IN PRE-OP WHERE PATIENTS ARE PREPPED FOR SURGERY.  ONLY 1 SUPPORT PERSON MAY BE PRESENT IN THE WAITING ROOM WHILE YOU ARE IN SURGERY.  IF YOU ARE TO BE ADMITTED, ONCE YOU ARE IN YOUR ROOM YOU WILL BE ALLOWED TWO (2) VISITORS. 1 (ONE) VISITOR MAY STAY OVERNIGHT BUT MUST ARRIVE TO THE ROOM BY 8pm.  Minor children may have two parents present. Special consideration for safety and communication needs will be reviewed on a case by case basis.  Special instructions:  Oral Hygiene is also important to reduce your risk of infection.  Remember - BRUSH YOUR TEETH THE MORNING OF SURGERY WITH YOUR REGULAR TOOTHPASTE   [facility]- Preparing For Surgery  Before surgery, you can play an important role. Because skin is not sterile, your skin needs to be as free of germs as possible. You can reduce the number of germs on your skin by washing with CHG (chlorahexidine gluconate) Soap before surgery.  CHG is an antiseptic cleaner which kills germs and bonds with the skin to continue killing germs even after washing.     Please do not use if you have an allergy to CHG or  antibacterial soaps. If your skin becomes reddened/irritated stop using the CHG.  Do not shave (including legs and underarms) for at least 48 hours prior to first CHG shower. It is OK to shave your face.  Please follow these instructions carefully.     Shower the NIGHT BEFORE SURGERY and the MORNING OF SURGERY with CHG Soap.   If you chose to wash your hair, wash your hair first as usual with your normal shampoo. After you shampoo, rinse your hair and body thoroughly to remove the shampoo.  Then [employer] and genitals (private parts) with your normal soap and rinse thoroughly to remove soap.  After that Use CHG Soap as you would any other liquid soap. You can apply CHG directly to the skin and wash gently with a scrungie or a clean washcloth.   Apply the CHG Soap to your body ONLY FROM THE NECK DOWN.  Do not use on open wounds or open sores. Avoid contact with your eyes, ears, mouth and genitals (private parts). Wash Face and genitals (private parts)  with your normal soap.   Wash thoroughly, paying special attention to the area where your surgery will be performed.  Thoroughly rinse your body with warm water from the neck down.  DO NOT shower/wash with your normal soap after using and rinsing off the CHG Soap.  Pat yourself dry with a CLEAN TOWEL.  Wear CLEAN PAJAMAS to bed the night before surgery  Place CLEAN SHEETS on your bed the night before your surgery  DO NOT SLEEP WITH PETS.   Day of Surgery:  Take a shower with CHG soap. Wear Clean/Comfortable clothing the morning of surgery Do not apply any deodorants/lotions.   Remember to brush your teeth WITH YOUR REGULAR TOOTHPASTE.   Please read over the following fact sheets that you were given.

## 2021-09-18 ENCOUNTER — Encounter (HOSPITAL_COMMUNITY)
Admission: RE | Admit: 2021-09-18 | Discharge: 2021-09-18 | Disposition: A | Discharge status: Home / Self Care | Payer: No Typology Code available for payment source | Source: Ambulatory Visit | Attending: General Surgery | Admitting: General Surgery

## 2021-09-18 ENCOUNTER — Other Ambulatory Visit: Payer: Self-pay

## 2021-09-18 ENCOUNTER — Encounter (HOSPITAL_COMMUNITY): Payer: Self-pay

## 2021-09-18 DIAGNOSIS — Z01810 Encounter for preprocedural cardiovascular examination: Secondary | ICD-10-CM | POA: Insufficient documentation

## 2021-09-18 NOTE — Progress Notes (Signed)
PCP - Dr. Micheline Rough Cardiologist - denies  PPM/ICD - n/a Device Orders - n/a Rep Notified - n/a  Chest x-ray - 05/09/21 EKG - 09/18/21 Stress Test - denies ECHO - denies Cardiac Cath - denies  Sleep Study - denies CPAP - n/a  Fasting Blood Sugar - n/a Checks Blood Sugar _____ times a day- n/a  Blood Thinner Instructions: n/a Aspirin Instructions: n/a  ERAS Protcol - Yes PRE-SURGERY Ensure or G2- No  COVID TEST- Ambulatory Surgery   Anesthesia review: No  Patient denies shortness of breath, fever, cough and chest pain at PAT appointment   All instructions explained to the patient, with a verbal understanding of the material. Patient agrees to go over the instructions while at home for a better understanding. Patient also instructed to self quarantine after being tested for COVID-19. The opportunity to ask questions was provided.

## 2021-09-22 NOTE — Anesthesia Preprocedure Evaluation (Addendum)
Anesthesia Evaluation  Patient identified by MRN, date of birth, ID band Patient awake    Reviewed: Allergy & Precautions, H&P , NPO status , Patient's Chart, lab work & pertinent test results  Airway Mallampati: II  TM Distance: >3 FB Neck ROM: Full    Dental no notable dental hx. (+) Teeth Intact, Dental Advisory Given   Pulmonary neg pulmonary ROS,    Pulmonary exam normal breath sounds clear to auscultation       Cardiovascular Exercise Tolerance: Good hypertension, Pt. on medications negative cardio ROS Normal cardiovascular exam Rhythm:Regular Rate:Normal     Neuro/Psych negative neurological ROS  negative psych ROS   GI/Hepatic negative GI ROS, Neg liver ROS, GERD  Medicated and Controlled,  Endo/Other  negative endocrine ROS  Renal/GU negative Renal ROS  negative genitourinary   Musculoskeletal negative musculoskeletal ROS (+)   Abdominal   Peds negative pediatric ROS (+)  Hematology negative hematology ROS (+)   Anesthesia Other Findings   Reproductive/Obstetrics negative OB ROS                            Anesthesia Physical Anesthesia Plan  ASA: 2  Anesthesia Plan: General   Post-op Pain Management: Ofirmev IV (intra-op) and Toradol IV (intra-op)   Induction: Intravenous  PONV Risk Score and Plan: 2 and Ondansetron, Dexamethasone and Treatment may vary due to age or medical condition  Airway Management Planned: Oral ETT and LMA  Additional Equipment: None  Intra-op Plan:   Post-operative Plan: Extubation in OR  Informed Consent: I have reviewed the patients History and Physical, chart, labs and discussed the procedure including the risks, benefits and alternatives for the proposed anesthesia with the patient or authorized representative who has indicated his/her understanding and acceptance.       Plan Discussed with: Anesthesiologist and CRNA  Anesthesia  Plan Comments: (  )        Anesthesia Quick Evaluation

## 2021-09-23 ENCOUNTER — Ambulatory Visit (HOSPITAL_COMMUNITY): Payer: No Typology Code available for payment source | Admitting: Anesthesiology

## 2021-09-23 ENCOUNTER — Other Ambulatory Visit (HOSPITAL_COMMUNITY): Payer: Self-pay

## 2021-09-23 ENCOUNTER — Encounter (HOSPITAL_COMMUNITY)
Admission: RE | Disposition: A | Discharge status: Home / Self Care | Payer: Self-pay | Source: Home / Self Care | Attending: General Surgery

## 2021-09-23 ENCOUNTER — Other Ambulatory Visit: Payer: Self-pay

## 2021-09-23 ENCOUNTER — Encounter (HOSPITAL_COMMUNITY): Payer: Self-pay | Admitting: General Surgery

## 2021-09-23 ENCOUNTER — Ambulatory Visit (HOSPITAL_COMMUNITY)
Admission: RE | Admit: 2021-09-23 | Discharge: 2021-09-23 | Disposition: A | Discharge status: Home / Self Care | Payer: No Typology Code available for payment source | Attending: General Surgery | Admitting: General Surgery

## 2021-09-23 DIAGNOSIS — I10 Essential (primary) hypertension: Secondary | ICD-10-CM | POA: Diagnosis not present

## 2021-09-23 DIAGNOSIS — K4020 Bilateral inguinal hernia, without obstruction or gangrene, not specified as recurrent: Secondary | ICD-10-CM | POA: Diagnosis not present

## 2021-09-23 DIAGNOSIS — K219 Gastro-esophageal reflux disease without esophagitis: Secondary | ICD-10-CM | POA: Insufficient documentation

## 2021-09-23 HISTORY — PX: INSERTION OF MESH: SHX5868

## 2021-09-23 HISTORY — PX: INGUINAL HERNIA REPAIR: SHX194

## 2021-09-23 SURGERY — REPAIR, HERNIA, INGUINAL, BILATERAL, LAPAROSCOPIC
Anesthesia: General | Site: Inguinal | Laterality: Bilateral

## 2021-09-23 MED ORDER — PROPOFOL 10 MG/ML IV BOLUS
INTRAVENOUS | Status: AC
  Filled 2021-09-23: qty 20

## 2021-09-23 MED ORDER — ROCURONIUM BROMIDE 10 MG/ML (PF) SYRINGE
PREFILLED_SYRINGE | INTRAVENOUS | Status: AC
  Filled 2021-09-23: qty 10

## 2021-09-23 MED ORDER — MEPERIDINE HCL 25 MG/ML IJ SOLN: 6.2500 mg | INTRAMUSCULAR | Status: DC | PRN

## 2021-09-23 MED ORDER — ONDANSETRON HCL 4 MG/2ML IJ SOLN: 4.0000 mg | Freq: Once | INTRAMUSCULAR | Status: DC | PRN

## 2021-09-23 MED ORDER — OXYCODONE HCL 5 MG/5ML PO SOLN: 5.0000 mg | Freq: Once | ORAL | Status: AC | PRN

## 2021-09-23 MED ORDER — SUGAMMADEX SODIUM 200 MG/2ML IV SOLN
INTRAVENOUS | Status: DC | PRN
Start: 2021-09-23 — End: 2021-09-23
  Administered 2021-09-23: 200 mg via INTRAVENOUS

## 2021-09-23 MED ORDER — FENTANYL CITRATE (PF) 250 MCG/5ML IJ SOLN
INTRAMUSCULAR | Status: DC | PRN
Start: 2021-09-23 — End: 2021-09-23
  Administered 2021-09-23 (×3): 50 ug via INTRAVENOUS
  Administered 2021-09-23: 100 ug via INTRAVENOUS

## 2021-09-23 MED ORDER — PROPOFOL 10 MG/ML IV BOLUS
INTRAVENOUS | Status: DC | PRN
  Administered 2021-09-23: 50 mg via INTRAVENOUS
  Administered 2021-09-23: 150 mg via INTRAVENOUS

## 2021-09-23 MED ORDER — FENTANYL CITRATE (PF) 100 MCG/2ML IJ SOLN
INTRAMUSCULAR | Status: AC
  Filled 2021-09-23: qty 2

## 2021-09-23 MED ORDER — DEXAMETHASONE SODIUM PHOSPHATE 10 MG/ML IJ SOLN
INTRAMUSCULAR | Status: DC | PRN
  Administered 2021-09-23: 10 mg via INTRAVENOUS

## 2021-09-23 MED ORDER — FENTANYL CITRATE (PF) 100 MCG/2ML IJ SOLN
25.0000 ug | INTRAMUSCULAR | Status: DC | PRN
  Administered 2021-09-23 (×2): 50 ug via INTRAVENOUS

## 2021-09-23 MED ORDER — ACETAMINOPHEN 325 MG PO TABS: 325.0000 mg | ORAL_TABLET | ORAL | Status: DC | PRN

## 2021-09-23 MED ORDER — MIDAZOLAM HCL 2 MG/2ML IJ SOLN
INTRAMUSCULAR | Status: DC | PRN
  Administered 2021-09-23: 2 mg via INTRAVENOUS

## 2021-09-23 MED ORDER — ORAL CARE MOUTH RINSE: 15.0000 mL | Freq: Once | OROMUCOSAL | Status: AC

## 2021-09-23 MED ORDER — ROCURONIUM BROMIDE 10 MG/ML (PF) SYRINGE
PREFILLED_SYRINGE | INTRAVENOUS | Status: DC | PRN
Start: 2021-09-23 — End: 2021-09-23
  Administered 2021-09-23: 50 mg via INTRAVENOUS
  Administered 2021-09-23: 20 mg via INTRAVENOUS

## 2021-09-23 MED ORDER — MIDAZOLAM HCL 2 MG/2ML IJ SOLN
INTRAMUSCULAR | Status: AC
  Filled 2021-09-23: qty 2

## 2021-09-23 MED ORDER — BUPIVACAINE HCL (PF) 0.25 % IJ SOLN
INTRAMUSCULAR | Status: AC
  Filled 2021-09-23: qty 30

## 2021-09-23 MED ORDER — DEXAMETHASONE SODIUM PHOSPHATE 10 MG/ML IJ SOLN
INTRAMUSCULAR | Status: AC
  Filled 2021-09-23: qty 1

## 2021-09-23 MED ORDER — LIDOCAINE 2% (20 MG/ML) 5 ML SYRINGE
INTRAMUSCULAR | Status: AC
  Filled 2021-09-23: qty 5

## 2021-09-23 MED ORDER — ONDANSETRON HCL 4 MG/2ML IJ SOLN
INTRAMUSCULAR | Status: AC
  Filled 2021-09-23: qty 2

## 2021-09-23 MED ORDER — BUPIVACAINE HCL (PF) 0.25 % IJ SOLN
INTRAMUSCULAR | Status: DC | PRN
  Administered 2021-09-23: 8 mL

## 2021-09-23 MED ORDER — ONDANSETRON HCL 4 MG/2ML IJ SOLN
INTRAMUSCULAR | Status: DC | PRN
  Administered 2021-09-23: 4 mg via INTRAVENOUS

## 2021-09-23 MED ORDER — EPHEDRINE 5 MG/ML INJ
INTRAVENOUS | Status: AC
  Filled 2021-09-23: qty 5

## 2021-09-23 MED ORDER — TRAMADOL HCL 50 MG PO TABS
50.0000 mg | ORAL_TABLET | Freq: Four times a day (QID) | ORAL | 0 refills | Status: AC | PRN
  Filled 2021-09-23: qty 20, 5d supply, fill #0

## 2021-09-23 MED ORDER — CEFAZOLIN SODIUM-DEXTROSE 2-4 GM/100ML-% IV SOLN
INTRAVENOUS | Status: AC
  Filled 2021-09-23: qty 100

## 2021-09-23 MED ORDER — 0.9 % SODIUM CHLORIDE (POUR BTL) OPTIME
TOPICAL | Status: DC | PRN
  Administered 2021-09-23: 1000 mL

## 2021-09-23 MED ORDER — CHLORHEXIDINE GLUCONATE 0.12 % MT SOLN
15.0000 mL | Freq: Once | OROMUCOSAL | Status: AC
  Administered 2021-09-23: 15 mL via OROMUCOSAL
  Filled 2021-09-23: qty 15

## 2021-09-23 MED ORDER — ACETAMINOPHEN 160 MG/5ML PO SOLN: 325.0000 mg | ORAL | Status: DC | PRN

## 2021-09-23 MED ORDER — OXYCODONE HCL 5 MG PO TABS
ORAL_TABLET | ORAL | Status: AC
  Filled 2021-09-23: qty 1

## 2021-09-23 MED ORDER — FENTANYL CITRATE (PF) 250 MCG/5ML IJ SOLN
INTRAMUSCULAR | Status: AC
  Filled 2021-09-23: qty 5

## 2021-09-23 MED ORDER — LACTATED RINGERS IV SOLN: INTRAVENOUS | Status: DC

## 2021-09-23 MED ORDER — LIDOCAINE 2% (20 MG/ML) 5 ML SYRINGE
INTRAMUSCULAR | Status: DC | PRN
Start: 2021-09-23 — End: 2021-09-23
  Administered 2021-09-23: 100 mg via INTRAVENOUS

## 2021-09-23 MED ORDER — CEFAZOLIN SODIUM-DEXTROSE 2-3 GM-%(50ML) IV SOLR
INTRAVENOUS | Status: DC | PRN
Start: 2021-09-23 — End: 2021-09-23
  Administered 2021-09-23: 2 g via INTRAVENOUS

## 2021-09-23 MED ORDER — EPHEDRINE SULFATE-NACL 50-0.9 MG/10ML-% IV SOSY
PREFILLED_SYRINGE | INTRAVENOUS | Status: DC | PRN
Start: 2021-09-23 — End: 2021-09-23
  Administered 2021-09-23 (×2): 10 mg via INTRAVENOUS

## 2021-09-23 MED ORDER — OXYCODONE HCL 5 MG PO TABS
5.0000 mg | ORAL_TABLET | Freq: Once | ORAL | Status: AC | PRN
  Administered 2021-09-23: 5 mg via ORAL

## 2021-09-23 SURGICAL SUPPLY — 48 items
APPLIER CLIP LOGIC TI 5 (MISCELLANEOUS) IMPLANT
BAG COUNTER SPONGE SURGICOUNT (BAG) ×3 IMPLANT
CANISTER SUCT 3000ML PPV (MISCELLANEOUS) ×1 IMPLANT
CHLORAPREP W/TINT 26 (MISCELLANEOUS) ×3 IMPLANT
COVER SURGICAL LIGHT HANDLE (MISCELLANEOUS) ×3 IMPLANT
DERMABOND ADVANCED (GAUZE/BANDAGES/DRESSINGS) ×1
DERMABOND ADVANCED .7 DNX12 (GAUZE/BANDAGES/DRESSINGS) ×2 IMPLANT
DISSECTOR BLUNT TIP ENDO 5MM (MISCELLANEOUS) IMPLANT
DRAPE LAPAROSCOPIC ABDOMINAL (DRAPES) ×2 IMPLANT
ELECT REM PT RETURN 9FT ADLT (ELECTROSURGICAL) ×3
ELECTRODE REM PT RTRN 9FT ADLT (ELECTROSURGICAL) ×2 IMPLANT
GLOVE SURG SYN 7.5  E (GLOVE) ×1
GLOVE SURG SYN 7.5 E (GLOVE) ×2 IMPLANT
GLOVE SURG SYN 7.5 PF PI (GLOVE) ×2 IMPLANT
GOWN STRL REUS W/ TWL LRG LVL3 (GOWN DISPOSABLE) ×4 IMPLANT
GOWN STRL REUS W/ TWL XL LVL3 (GOWN DISPOSABLE) ×2 IMPLANT
GOWN STRL REUS W/TWL LRG LVL3 (GOWN DISPOSABLE) ×2
GOWN STRL REUS W/TWL XL LVL3 (GOWN DISPOSABLE) ×1
KIT BASIN OR (CUSTOM PROCEDURE TRAY) ×3 IMPLANT
KIT TURNOVER KIT B (KITS) ×3 IMPLANT
MESH 3DMAX 5X7 LT XLRG (Mesh General) ×1 IMPLANT
MESH 3DMAX 5X7 RT XLRG (Mesh General) ×1 IMPLANT
NDL INSUFFLATION 14GA 120MM (NEEDLE) IMPLANT
NEEDLE INSUFFLATION 14GA 120MM (NEEDLE) ×3 IMPLANT
NS IRRIG 1000ML POUR BTL (IV SOLUTION) ×3 IMPLANT
PAD ARMBOARD 7.5X6 YLW CONV (MISCELLANEOUS) ×6 IMPLANT
RELOAD STAPLE 4.0 BLU F/HERNIA (INSTRUMENTS) IMPLANT
RELOAD STAPLE 4.8 BLK F/HERNIA (STAPLE) IMPLANT
RELOAD STAPLE HERNIA 4.0 BLUE (INSTRUMENTS) ×3 IMPLANT
RELOAD STAPLE HERNIA 4.8 BLK (STAPLE) IMPLANT
SCISSORS LAP 5X35 DISP (ENDOMECHANICALS) ×3 IMPLANT
SET IRRIG TUBING LAPAROSCOPIC (IRRIGATION / IRRIGATOR) IMPLANT
SET TUBE SMOKE EVAC HIGH FLOW (TUBING) ×3 IMPLANT
STAPLER HERNIA 12 8.5 360D (INSTRUMENTS) ×1 IMPLANT
SUT MNCRL AB 4-0 PS2 18 (SUTURE) ×3 IMPLANT
SUT VIC AB 1 CT1 27 (SUTURE)
SUT VIC AB 1 CT1 27XBRD ANBCTR (SUTURE) IMPLANT
SUT VICRYL 0 ENDOLOOP (SUTURE) ×1 IMPLANT
TOWEL GREEN STERILE (TOWEL DISPOSABLE) ×3 IMPLANT
TOWEL GREEN STERILE FF (TOWEL DISPOSABLE) ×3 IMPLANT
TRAY FOLEY W/BAG SLVR 16FR (SET/KITS/TRAYS/PACK)
TRAY FOLEY W/BAG SLVR 16FR ST (SET/KITS/TRAYS/PACK) ×2 IMPLANT
TRAY LAPAROSCOPIC MC (CUSTOM PROCEDURE TRAY) ×3 IMPLANT
TROCAR OPTICAL SHORT 5MM (TROCAR) ×3 IMPLANT
TROCAR OPTICAL SLV SHORT 5MM (TROCAR) ×3 IMPLANT
TROCAR XCEL 12X100 BLDLESS (ENDOMECHANICALS) ×3 IMPLANT
WARMER LAPAROSCOPE (MISCELLANEOUS) ×3 IMPLANT
WATER STERILE IRR 1000ML POUR (IV SOLUTION) ×2 IMPLANT

## 2021-09-23 NOTE — Anesthesia Procedure Notes (Signed)
Procedure Name: Intubation Date/Time: 09/23/2021 7:38 AM Performed by: Michele Rockers, CRNA Pre-anesthesia Checklist: Patient identified, Patient being monitored, Timeout performed, Emergency Drugs available and Suction available Patient Re-evaluated:Patient Re-evaluated prior to induction Oxygen Delivery Method: Circle system utilized Preoxygenation: Pre-oxygenation with 100% oxygen Induction Type: IV induction Ventilation: Mask ventilation without difficulty Laryngoscope Size: Miller and 2 Grade View: Grade I Tube type: Oral Tube size: 7.5 mm Number of attempts: 1 Airway Equipment and Method: Stylet Placement Confirmation: ETT inserted through vocal cords under direct vision, positive ETCO2 and breath sounds checked- equal and bilateral Secured at: 23 cm Tube secured with: Tape Dental Injury: Teeth and Oropharynx as per pre-operative assessment

## 2021-09-23 NOTE — Discharge Instructions (Signed)
[facility] _______[facility] ? ?INGUINAL HERNIA REPAIR: POST OP INSTRUCTIONS ? ?Always review your discharge instruction sheet given to you by the facility where your surgery was performed. ?IF YOU HAVE DISABILITY OR FAMILY LEAVE FORMS, YOU MUST BRING THEM TO THE OFFICE FOR PROCESSING.   ?DO NOT GIVE THEM TO YOUR DOCTOR. ? ?1. A  prescription for pain medication may be given to you upon discharge.  Take your pain medication as prescribed, if needed.  If narcotic pain medicine is not needed, then you may take acetaminophen (Tylenol) or ibuprofen (Advil) as needed. ?2. Take your usually prescribed medications unless otherwise directed. ?If you need a refill on your pain medication, please contact your pharmacy.  They will contact our office to request authorization. Prescriptions will not be filled after 5 pm or on week-ends. ?3. You should follow a light diet the first 24 hours after arrival home, such as soup and crackers, etc.  Be sure to include lots of fluids daily.  Resume your normal diet the day after surgery. ?4.Most patients will experience some swelling and bruising around the umbilicus or in the groin and scrotum.  Ice packs and reclining will help.  Swelling and bruising can take several days to resolve.  ?6. It is common to experience some constipation if taking pain medication after surgery.  Increasing fluid intake and taking a stool softener (such as Colace) will usually help or prevent this problem from occurring.  A mild laxative (Milk of Magnesia or Miralax) should be taken according to package directions if there are no bowel movements after 48 hours. ?7. Unless discharge instructions indicate otherwise, you may remove your bandages 24-48 hours after surgery, and you may shower at that time.  You may have steri-strips (small skin tapes) in place directly over the incision.  These strips should be left on the skin for 7-10 days.  If your surgeon used skin glue on the incision, you may  shower in 24 hours.  The glue will flake off over the next 2-3 weeks.  Any sutures or staples will be removed at the office during your follow-up visit. ?8. ACTIVITIES:  You may resume regular (light) daily activities beginning the next day--such as daily self-care, walking, climbing stairs--gradually increasing activities as tolerated.  You may have sexual intercourse when it is comfortable.  Refrain from any heavy lifting or straining until approved by your doctor. ? ?a.You may drive when you are no longer taking prescription pain medication, you can comfortably wear a seatbelt, and you can safely maneuver your car and apply brakes. ?b.RETURN TO WORK:   ?_____________________________________________ ? ?9.You should see your doctor in the office for a follow-up appointment approximately 2-3 weeks after your surgery.  Make sure that you call for this appointment within a day or two after you arrive home to insure a convenient appointment time. ?10.OTHER INSTRUCTIONS: _________________________ ?   _____________________________________ ? ?WHEN TO CALL YOUR DOCTOR: ?Fever over 101.0 ?Inability to urinate ?Nausea and/or vomiting ?Extreme swelling or bruising ?Continued bleeding from incision. ?Increased pain, redness, or drainage from the incision ? ?The clinic staff is available to answer your questions during regular business hours.  Please don?t hesitate to call and ask to speak to one of the nurses for clinical concerns.  If you have a medical emergency, go to the nearest emergency room or call 911.  A surgeon from [facility] is always on call at the hospital ? ? ?[address], [address], [place], [place]    [address] ? ? [address] [address], [place], [place]   [address] ?[phone] ? [phone] ? FAX [phone] ?Web site: [email or web address] ? ?

## 2021-09-23 NOTE — Anesthesia Postprocedure Evaluation (Signed)
Anesthesia Post Note  Patient: Lawrence Wang  Procedure(s) Performed: LAPAROSCOPIC BILATERAL INGUINAL HERNIA REPAIR WITH MESH (Bilateral: Abdomen) INSERTION OF MESH (Bilateral: Inguinal)     Patient location during evaluation: PACU Anesthesia Type: General Level of consciousness: awake and alert Pain management: pain level controlled Vital Signs Assessment: post-procedure vital signs reviewed and stable Respiratory status: spontaneous breathing, nonlabored ventilation, respiratory function stable and patient connected to nasal cannula oxygen Cardiovascular status: blood pressure returned to baseline and stable Postop Assessment: no apparent nausea or vomiting Anesthetic complications: no   No notable events documented.  Last Vitals:  Vitals:   09/23/21 0618  BP: 133/80  Pulse: 66  Resp: 18  Temp: 36.7 C  SpO2: 100%    Last Pain:  Vitals:   09/23/21 0625  TempSrc:   PainSc: 0-No pain                 Tanette Chauca

## 2021-09-23 NOTE — Op Note (Signed)
09/23/2021  8:34 AM  PATIENT:  Lawrence Wang  66 y.o. male  PRE-OPERATIVE DIAGNOSIS:  BILATERAL INGUINAL HERNIA  POST-OPERATIVE DIAGNOSIS:  BILATERAL INGUINAL HERNIA, LEFT DIRECT, RIGHT INDIRECT  PROCEDURE:  Procedure(s): LAPAROSCOPIC BILATERAL INGUINAL HERNIA REPAIR WITH MESH (Bilateral) INSERTION OF MESH (Bilateral)  SURGEON:  Surgeon(s) and Role:    Ralene Ok, MD - Primary  ASSISTANTS: Baltazar Apo, MD PGY-5   ANESTHESIA:   local and general  EBL:  none   BLOOD ADMINISTERED:none  DRAINS: none   LOCAL MEDICATIONS USED:  BUPIVICAINE   SPECIMEN:  No Specimen  DISPOSITION OF SPECIMEN:  N/A  COUNTS:  YES  TOURNIQUET:  * No tourniquets in log *  DICTATION: .Dragon Dictation  Counts: reported as correct x 2  Findings:  The patient had a small indirect right & large left direct hernia  Indications for procedure:  The patient is a 66 year old male with bilateral hernias for several months. Patient complained of symptomatology to his bilateral inguinal areas. The patient was taken back for elective inguinal hernia repair.  Details of the procedure: The patient was taken back to the operating room. The patient was placed in supine position with bilateral SCDs in place.  The patient underwent GETA.  The patient was prepped and draped in the usual sterile fashion.  After appropriate anitbiotics were confirmed, a time-out was confirmed and all facts were verified.  0.25% Marcaine was used to infiltrate the umbilical area. A 11-blade was used to cut down the skin and blunt dissection was used to get the anterior fashion.  The anterior fascia was incised approximately 1 cm and the muscles were retracted laterally. Blunt dissection was then used to create a space in the preperitoneal area. At this time a 10 mm camera was then introduced into the space and advanced the pubic tubercle and a 12 mm trocar was placed over this and insufflation was started.  At this time and  space was created from medial to laterally the preperitoneal space.  Cooper's ligament was initially cleaned off.  The hernia sac was identified and dissected away from the cremesteric muscle fibers.  The hernia was seen in the indirect space. Dissection of the hernia sac was undertaken the vas deferens was identified and protected in all parts of the case.   There was a small tear into the hernia sac.  This was ligated with a 0 PDS.  Once the hernia sac was taken down to approximately the umbilicus a Bard 3D Max mesh, size: [name], was  introduced into the preperitoneal space.  The mesh was brought over to cover the direct and indirect hernia spaces.  This was anchored into place and secured to Cooper's ligament with 4.34mm staples from a Coviden hernia stapler. It was anchored to the anterior abdominal wall with 4.8 mm staples. The hernia sac was seen lying posterior to the mesh. There was no staples placed laterally.   The exact same dissection took place on the opposite side.  The hernia sac was identified in the direct space.  The transversalis fascia was dissected away from the the preperitoneal fat.  The spermatic cord was identified. and dissected away from the cremesteric muscle fibers.  The hernia was no indirect hernia seen. Dissection of the hernia sac was undertaken the vas deferens was identified and protected in all parts of the case.   Once the hernia sac was taken down to approximately the umbilicus a Bard 3D Max mesh, size: [name], was  introduced into  the preperitoneal space.  The mesh was brought over to cover the direct and indirect hernia spaces.  This was anchored into place and secured to Cooper's ligament with 4.77mm staples from a Coviden hernia stapler. It was anchored to the anterior abdominal wall with 4.8 mm staples. The hernia sac was seen lying posterior to the mesh. There was no staples placed laterally.    The insufflation was evacuated and the peritoneum was seen  posterior to the mesh bilaterally. The trochars were removed. The anterior fascia was reapproximated using #1 Vicryl on a UR- 6.  Intra-abdominal air was evacuated and the Veress needle removed. The skin was reapproximated using 4-0 Monocryl subcuticular fashion and was dressed with Dermabond. The  patient was awakened from general anesthesia and taken to recovery in stable condition.   PLAN OF CARE: Discharge to home after PACU  PATIENT DISPOSITION:  PACU - hemodynamically stable.   Delay start of Pharmacological VTE agent (>24hrs) due to surgical blood loss or risk of bleeding: not applicable

## 2021-09-23 NOTE — Transfer of Care (Signed)
Immediate Anesthesia Transfer of Care Note  Patient: Lawrence Wang  Procedure(s) Performed: LAPAROSCOPIC BILATERAL INGUINAL HERNIA REPAIR WITH MESH (Bilateral: Abdomen) INSERTION OF MESH (Bilateral: Inguinal)  Patient Location: PACU  Anesthesia Type:General  Level of Consciousness: drowsy, patient cooperative and responds to stimulation  Airway & Oxygen Therapy: Patient Spontanous Breathing and Patient connected to nasal cannula oxygen  Post-op Assessment: Report given to RN, Post -op Vital signs reviewed and stable and Patient moving all extremities X 4  Post vital signs: Reviewed and stable  Last Vitals:  Vitals Value Taken Time  BP    Temp    Pulse 97 09/23/21 0857  Resp 18 09/23/21 0858  SpO2 97 % 09/23/21 0857  Vitals shown include unvalidated device data.  Last Pain:  Vitals:   09/23/21 0625  TempSrc:   PainSc: 0-No pain         Complications: No notable events documented.

## 2021-09-23 NOTE — H&P (Signed)
Chief Complaint: Hernia   History of Present Illness: Lawrence Wang is a 66 y.o. male who is seen today as an office consultation at the request of Dr. Carlisle Cater for evaluation of Hernia .  Patient is a 66 year old male, who comes in with a 1 to 9-month history of left inguinal hernia. He states that he began noticing pain and discomfort to the area. He has noticed a bulge. Patient is otherwise active. He is a Orthoptist, singer.  Patient's had no previous abdominal surgery aside from appendectomy.  Review of Systems: A complete review of systems was obtained from the patient. I have reviewed this information and discussed as appropriate with the patient. See HPI as well for other ROS.  Review of Systems  Constitutional: Negative for fever.  HENT: Negative for congestion.  Eyes: Negative for blurred vision.  Respiratory: Negative for cough, shortness of breath and wheezing.  Cardiovascular: Negative for chest pain and palpitations.  Gastrointestinal: Negative for heartburn.  Genitourinary: Negative for dysuria.  Musculoskeletal: Negative for myalgias.  Skin: Negative for rash.  Neurological: Negative for dizziness and headaches.  Psychiatric/Behavioral: Negative for depression and suicidal ideas.  All other systems reviewed and are negative.   Medical History: Past Medical History:  Diagnosis Date   GERD (gastroesophageal reflux disease)   Hypertension   There is no problem list on file for this patient.  Past Surgical History:  Procedure Laterality Date   APPENDECTOMY   TONSILLECTOMY    Allergies  Allergen Reactions   Azithromycin Other (See Comments)  Hiccups-per patient   Peanut Headache   Peanut Oil Other (See Comments)  Headaches   Current Outpatient Medications on File Prior to Visit  Medication Sig Dispense Refill   benazepriL (LOTENSIN) 10 MG tablet benazepril 10 mg tablet   betamethasone dipropionate (DIPROSONE) 0.05 % cream betamethasone dipropionate  0.05 % topical cream APPLY TOPICALLY TWO TIMES DAILY   meloxicam (MOBIC) 7.5 MG tablet meloxicam 7.5 mg tablet   sildenafiL (VIAGRA) 25 MG tablet sildenafil 25 mg tablet TAKE 1-3 TABLETS BY MOUTH AS NEEDED PRIOR TO SEXUAL INTERCOURSE. DO NOT USE MORE THEN 1 DOSE IN 24 HOURS   chlorproMAZINE (THORAZINE) 25 MG tablet chlorpromazine 25 mg tablet   cyclobenzaprine (FLEXERIL) 5 MG tablet cyclobenzaprine 5 mg tablet   multivitamin capsule Take 1 capsule by mouth once daily   omeprazole (PRILOSEC) 20 MG DR capsule omeprazole 20 mg capsule,delayed release   No current facility-administered medications on file prior to visit.   History reviewed. No pertinent family history.   Social History   Tobacco Use  Smoking Status Never  Smokeless Tobacco Never    Social History   Socioeconomic History   Marital status: Married  Tobacco Use   Smoking status: Never   Smokeless tobacco: Never  Vaping Use   Vaping Use: Never used  Substance and Sexual Activity   Alcohol use: Yes   Drug use: Never   Objective:   Vitals:  07/29/21 0923  BP: (!) 122/90  Pulse: 85  Temp: 36.8 C (98.2 F)  SpO2: 99%  Weight: (!) 103 kg (227 lb)  Height: 190.5 cm (6\' 3" )   Body mass index is 28.37 kg/m. Physical Exam Constitutional:  Appearance: Normal appearance.  HENT:  Head: Normocephalic and atraumatic.  Nose: Nose normal. No congestion.  Mouth/Throat:  Mouth: Mucous membranes are moist.  Pharynx: Oropharynx is clear.  Eyes:  Pupils: Pupils are equal, round, and reactive to light.  Cardiovascular:  Rate and Rhythm: Normal rate and  regular rhythm.  Pulses: Normal pulses.  Heart sounds: Normal heart sounds. No murmur heard. No friction rub. No gallop.  Pulmonary:  Effort: Pulmonary effort is normal. No respiratory distress.  Breath sounds: Normal breath sounds. No stridor. No wheezing, rhonchi or rales.  Abdominal:  General: Abdomen is flat.  Hernia: A hernia is present. Hernia is present in  the left inguinal area and right inguinal area.  Comments: L>R  Musculoskeletal:  General: Normal range of motion.  Cervical back: Normal range of motion.  Skin: General: Skin is warm and dry.  Neurological:  General: No focal deficit present.  Mental Status: He is alert and oriented to person, place, and time.  Psychiatric:  Mood and Affect: Mood normal.  Thought Content: Thought content normal.     Assessment and Plan:  Diagnoses and all orders for this visit:  Non-recurrent unilateral inguinal hernia without obstruction or gangrene   Lawrence Wang is a 66 y.o. male   1. We will proceed to the OR for a bilateral laparoscopic inguinal hernia repair with mesh. 2. All risks and benefits were discussed with the patient, to generally include infection, bleeding, damage to surrounding structures, acute and chronic nerve pain, and recurrence. Alternatives were offered and described. All questions were answered and the patient voiced understanding of the procedure and wishes to proceed at this point.  No follow-ups on file.  Ralene Ok, MD, [facility], [facility]

## 2021-09-24 ENCOUNTER — Encounter (HOSPITAL_COMMUNITY): Payer: Self-pay | Admitting: General Surgery

## 2021-10-03 ENCOUNTER — Other Ambulatory Visit (HOSPITAL_COMMUNITY): Payer: Self-pay

## 2021-11-05 ENCOUNTER — Other Ambulatory Visit (HOSPITAL_COMMUNITY): Payer: Self-pay

## 2021-11-06 ENCOUNTER — Other Ambulatory Visit (HOSPITAL_COMMUNITY): Payer: Self-pay

## 2021-11-21 ENCOUNTER — Other Ambulatory Visit (HOSPITAL_COMMUNITY): Payer: Self-pay

## 2021-11-21 MED ORDER — OTEZLA 30 MG PO TABS
1.0000 | ORAL_TABLET | Freq: Two times a day (BID) | ORAL | 11 refills | Status: DC
  Filled 2021-11-21: qty 60, 30d supply, fill #0

## 2021-11-22 ENCOUNTER — Telehealth: Payer: Self-pay | Admitting: Pharmacist

## 2021-11-22 ENCOUNTER — Other Ambulatory Visit (HOSPITAL_COMMUNITY): Payer: Self-pay

## 2021-11-22 ENCOUNTER — Ambulatory Visit: Payer: No Typology Code available for payment source | Attending: Family Medicine | Admitting: Pharmacist

## 2021-11-22 DIAGNOSIS — Z79899 Other long term (current) drug therapy: Secondary | ICD-10-CM

## 2021-11-22 MED ORDER — OTEZLA 30 MG PO TABS
1.0000 | ORAL_TABLET | Freq: Two times a day (BID) | ORAL | 11 refills | Status: DC
  Filled 2021-11-22 – 2021-11-28 (×3): qty 60, 30d supply, fill #0
  Filled 2022-01-08: qty 60, 30d supply, fill #1
  Filled 2022-01-28: qty 60, 30d supply, fill #2
  Filled 2022-02-20: qty 60, 30d supply, fill #3
  Filled 2022-03-25: qty 60, 30d supply, fill #4
  Filled 2022-04-29: qty 60, 30d supply, fill #5
  Filled 2022-05-27: qty 60, 30d supply, fill #6
  Filled 2022-06-24: qty 60, 30d supply, fill #7
  Filled 2022-08-08: qty 60, 30d supply, fill #8
  Filled 2022-09-08: qty 60, 30d supply, fill #9
  Filled 2022-10-01: qty 60, 30d supply, fill #10
  Filled 2022-11-03: qty 60, 30d supply, fill #11

## 2021-11-22 NOTE — Telephone Encounter (Signed)
Called patient to schedule an appointment for the [employer] [facility]. I was unable to reach the patient so I left a HIPAA-compliant message requesting that the patient return my call.   Luke Van Ausdall, PharmD, BCACP, CPP Clinical Pharmacist [facility] [phone]  

## 2021-11-22 NOTE — Progress Notes (Signed)
?  S: ?Patient presents for review of their specialty medication therapy. ? ?Patient is about to start taking Otezla for psoriasis. Patient is managed by Dr. Elvera Lennox for this.  ? ?Adherence: has not yet started  ? ?Efficacy: has not yet started  ? ?Dosing:  ?Active psoriatic arthritis or plaque psoriasis (moderate to severe): Oral: Initial: 10 mg in the morning. Titrate upward by additional 10 mg per day on days 2 to 5 as follows: Day 2: 10 mg twice daily; Day 3: 10 mg in the morning and 20 mg in the evening; Day 4: 20 mg twice daily; Day 5: 20 mg in the morning and 30 mg in the evening. Maintenance dose: 30 mg twice daily starting on day 6 ? ?CrCl <30 mL/minute: Initial: 10 mg in the morning on days 1 to 3; titrate using morning doses only (skip evening doses) to 20 mg on days 4 and 5. Maintenance dose: 30 mg once daily in the morning starting on day 6.  ? ? ?Current adverse effects: ?Headache: none ?GI upset: none ?Weight loss: none ?Neuropsychiatric effects: none ? ?O: ?   ? ?Lab Results  ?Component Value Date  ? WBC 5.1 09/02/2021  ? HGB 15.6 09/02/2021  ? HCT 47.2 09/02/2021  ? MCV 89.3 09/02/2021  ? PLT 211.0 09/02/2021  ? ? ?  Chemistry   ?   ?Component Value Date/Time  ? NA 138 09/02/2021 0853  ? K 4.1 09/02/2021 0853  ? CL 100 09/02/2021 0853  ? CO2 32 09/02/2021 0853  ? BUN 12 09/02/2021 0853  ? CREATININE 0.82 09/02/2021 0853  ? CREATININE 0.88 03/30/2020 0731  ?    ?Component Value Date/Time  ? CALCIUM 9.5 09/02/2021 0853  ? ALKPHOS 14 (L) 09/02/2021 0853  ? AST 20 09/02/2021 0853  ? ALT 19 09/02/2021 0853  ? BILITOT 0.8 09/02/2021 0853  ?  ? ? ? ?A/P: ?1. Medication review: patient is about to start [facility] for psoriasis. Reviewed the medication including the following: apremilast inhibits phosphodiesterase 4 (PDE4) specific for cyclic adenosine monophosphate (cAMP) which results in increased intracellular cAMP levels and regulation of numerous inflammatory mediators (eg, decreased expression of nitric  oxide synthase, TNF-alpha, and interleukin [IL]-23, as well as increased IL-10. Patient educated on purpose, proper use and potential adverse effects of Otezla. Possible adverse effects include weight loss, GI upset, headache, and mood changes. Renal function should be routinely monitored. Administer without regard to food. Do not crush, chew, or split tablets. No recommendations for any changes at this time. ? ?Benard Halsted, PharmD, BCACP, CPP ?Clinical Pharmacist ?[place] ?[phone] ? ? ? ? ? ?

## 2021-11-23 ENCOUNTER — Other Ambulatory Visit (HOSPITAL_COMMUNITY): Payer: Self-pay

## 2021-11-26 ENCOUNTER — Other Ambulatory Visit (HOSPITAL_COMMUNITY): Payer: Self-pay

## 2021-11-28 ENCOUNTER — Other Ambulatory Visit (HOSPITAL_COMMUNITY): Payer: Self-pay

## 2021-12-03 ENCOUNTER — Other Ambulatory Visit (HOSPITAL_COMMUNITY): Payer: Self-pay

## 2021-12-12 ENCOUNTER — Other Ambulatory Visit: Payer: No Typology Code available for payment source

## 2022-01-02 ENCOUNTER — Other Ambulatory Visit (HOSPITAL_COMMUNITY): Payer: Self-pay

## 2022-01-08 ENCOUNTER — Other Ambulatory Visit (HOSPITAL_COMMUNITY): Payer: Self-pay

## 2022-01-28 ENCOUNTER — Other Ambulatory Visit (HOSPITAL_COMMUNITY): Payer: Self-pay

## 2022-02-04 ENCOUNTER — Other Ambulatory Visit (HOSPITAL_COMMUNITY): Payer: Self-pay

## 2022-02-12 ENCOUNTER — Other Ambulatory Visit (HOSPITAL_COMMUNITY): Payer: Self-pay

## 2022-02-19 ENCOUNTER — Other Ambulatory Visit (HOSPITAL_COMMUNITY): Payer: Self-pay

## 2022-02-20 ENCOUNTER — Other Ambulatory Visit (HOSPITAL_COMMUNITY): Payer: Self-pay

## 2022-02-27 ENCOUNTER — Other Ambulatory Visit (HOSPITAL_COMMUNITY): Payer: Self-pay

## 2022-03-20 ENCOUNTER — Other Ambulatory Visit (HOSPITAL_COMMUNITY): Payer: Self-pay

## 2022-03-20 MED ORDER — CALCIPOTRIENE 0.005 % EX OINT
TOPICAL_OINTMENT | CUTANEOUS | 11 refills | Status: AC
  Filled 2022-03-20: qty 60, 30d supply, fill #0
  Filled 2022-10-09: qty 60, 30d supply, fill #1

## 2022-03-21 ENCOUNTER — Other Ambulatory Visit (HOSPITAL_COMMUNITY): Payer: Self-pay

## 2022-03-25 ENCOUNTER — Other Ambulatory Visit (HOSPITAL_COMMUNITY): Payer: Self-pay

## 2022-04-01 ENCOUNTER — Other Ambulatory Visit (HOSPITAL_COMMUNITY): Payer: Self-pay

## 2022-04-18 ENCOUNTER — Other Ambulatory Visit (HOSPITAL_COMMUNITY): Payer: Self-pay

## 2022-04-29 ENCOUNTER — Other Ambulatory Visit (HOSPITAL_COMMUNITY): Payer: Self-pay

## 2022-05-05 ENCOUNTER — Other Ambulatory Visit (HOSPITAL_COMMUNITY): Payer: Self-pay

## 2022-05-13 ENCOUNTER — Other Ambulatory Visit: Payer: Self-pay

## 2022-05-16 ENCOUNTER — Ambulatory Visit (INDEPENDENT_AMBULATORY_CARE_PROVIDER_SITE_OTHER): Payer: No Typology Code available for payment source | Admitting: Family Medicine

## 2022-05-16 ENCOUNTER — Other Ambulatory Visit (HOSPITAL_COMMUNITY): Payer: Self-pay

## 2022-05-16 VITALS — BP 110/62 | HR 97 | Temp 97.9°F | Wt 218.0 lb

## 2022-05-16 DIAGNOSIS — Z23 Encounter for immunization: Secondary | ICD-10-CM

## 2022-05-16 DIAGNOSIS — Z13220 Encounter for screening for lipoid disorders: Secondary | ICD-10-CM

## 2022-05-16 DIAGNOSIS — L409 Psoriasis, unspecified: Secondary | ICD-10-CM | POA: Insufficient documentation

## 2022-05-16 DIAGNOSIS — M25511 Pain in right shoulder: Secondary | ICD-10-CM

## 2022-05-16 DIAGNOSIS — I10 Essential (primary) hypertension: Secondary | ICD-10-CM | POA: Diagnosis not present

## 2022-05-16 DIAGNOSIS — M7521 Bicipital tendinitis, right shoulder: Secondary | ICD-10-CM

## 2022-05-16 MED ORDER — BENAZEPRIL HCL 10 MG PO TABS
ORAL_TABLET | Freq: Every day | ORAL | 3 refills | Status: DC
Start: 2022-05-16 — End: 2023-05-12
  Filled 2022-05-16: qty 90, 90d supply, fill #0
  Filled 2022-08-19: qty 90, 90d supply, fill #1
  Filled 2022-11-16: qty 90, 90d supply, fill #2
  Filled 2023-02-08: qty 90, 90d supply, fill #3

## 2022-05-16 MED ORDER — MELOXICAM 7.5 MG PO TABS
7.5000 mg | ORAL_TABLET | Freq: Every day | ORAL | 1 refills | Status: DC | PRN
  Filled 2022-05-16: qty 90, 45d supply, fill #0

## 2022-05-16 NOTE — Assessment & Plan Note (Signed)
On Lawrence Wang, doing well, no side effects reported. Continue this per dermatology.

## 2022-05-16 NOTE — Patient Instructions (Signed)
COVID booster, RSV vaccine are the recommended.

## 2022-05-16 NOTE — Progress Notes (Signed)
Established Patient Office Visit  Subjective   Patient ID: Lawrence Wang, male    DOB: [birth year]  Age: 65 y.o. MRN: [id number]  Chief Complaint  Patient presents with   Transitions Of Care    Patient is here for transition of care.   HTN -- BP in office performed and is well controlled. She reports no side effects to the medications, no chest pain, SOB, dizziness or headaches. She has a BP cuff at home and is checking her BP regularly, reports they are in the normal range.    Patient is reporting a small tickle in his throat with a dry cough. He took a COVID test at home and it was negative. No fever/chills, no fatigue or malaise. No sick contacts that he knows of. We reviewed his HM measures, getting flu shot today, we also discussed the RSV and COVID vaccines.    Patient Active Problem List   Diagnosis Date Noted   Psoriasis 05/16/2022   Hypertension 04/19/2015   GERD (gastroesophageal reflux disease) 04/19/2015   Activities involving scuba diving 09/08/2014      Review of Systems  All other systems reviewed and are negative.     Objective:     BP 110/62 (BP Location: Right Arm, Patient Position: Sitting, Cuff Size: Normal)   Pulse 97   Temp 97.9 F (36.6 C) (Oral)   Wt 218 lb (98.9 kg)   SpO2 98%   BMI 27.25 kg/m  BP Readings from Last 3 Encounters:  05/16/22 110/62  09/23/21 (!) 143/87  09/18/21 (!) 151/97   Wt Readings from Last 3 Encounters:  05/16/22 218 lb (98.9 kg)  09/23/21 230 lb (104.3 kg)  09/18/21 227 lb 14.4 oz (103.4 kg)      Physical Exam Vitals reviewed.  Constitutional:      Appearance: Normal appearance. He is well-groomed and normal weight.  Eyes:     Conjunctiva/sclera: Conjunctivae normal.  Neck:     Thyroid: No thyromegaly.  Cardiovascular:     Rate and Rhythm: Normal rate and regular rhythm.     Heart sounds: S1 normal and S2 normal. No murmur heard. Pulmonary:     Effort: Pulmonary effort is normal.     Breath sounds:  Normal breath sounds and air entry. No rales.  Abdominal:     General: Abdomen is flat. Bowel sounds are normal.     Tenderness: There is no abdominal tenderness.  Musculoskeletal:     Right lower leg: No edema.     Left lower leg: No edema.  Neurological:     General: No focal deficit present.     Mental Status: He is alert and oriented to person, place, and time.     Gait: Gait is intact.     Deep Tendon Reflexes: Reflexes are normal and symmetric.  Psychiatric:        Mood and Affect: Mood and affect normal.        Behavior: Behavior normal.    No results found for any visits on 05/16/22.  Last lipids Lab Results  Component Value Date   CHOL 232 (H) 09/02/2021   HDL 49.80 09/02/2021   LDLCALC 154 (H) 09/02/2021   TRIG 143.0 09/02/2021   CHOLHDL 5 09/02/2021      The 10-year ASCVD risk score (Arnett DK, et al., 2019) is: 11.9%    Assessment & Plan:   Problem List Items Addressed This Visit       Cardiovascular and Mediastinum   Hypertension -  Primary    Current hypertension medications:       Sig   benazepril (LOTENSIN) 10 MG tablet TAKE 1 TABLET BY MOUTH ONCE DAILY   sildenafil (VIAGRA) 25 MG tablet TAKE 1-3 TABLETS BY MOUTH AS NEEDED PRIOR TO SEXUAL INTERCOURSE. DO NOT USE MORE THEN 1 DOSE IN 24 HOURS  Well controlled today on 10 mg benazepril. Will refill this for him today. patient has lost about 12 pounds since his last visit, denies any palpitations or dizziness/ passing out sx, I advised he continue to check his BP at home and if he develops any sx of hypotension he should call the office.  Will check new CMP today.     Relevant Medications   benazepril (LOTENSIN) 10 MG tablet   Other Relevant Orders   CMP     Musculoskeletal and Integument   Psoriasis (Chronic)    On Ortezla, doing well, no side effects reported. Continue this per dermatology.      Other Visit Diagnoses     Immunization due       Relevant Orders   Flu Vaccine QUAD 6+ mos PF IM  (Fluarix Quad PF) (Completed)   Lipid screening       Relevant Orders   Lipid Panel -- needs new lipid panel today, pt not currently on statin therapy even though his 10 year CVD risk score is >10%, will discuss with patient after getting the results of his labs today.   Acute pain of right shoulder       Tendinitis of long head of biceps brachii of right shoulder       Relevant Medications   meloxicam (MOBIC) 7.5 MG tablet  Patient needs refills of his mobic 7.5 mg daily, state she occasionally gets recurrence of pain in the right shoulder, not constant, will refill the medication today.     Return in about 1 year (around 05/17/2023) for yearly annual physical.    Farrel Conners, MD

## 2022-05-16 NOTE — Assessment & Plan Note (Signed)
Current hypertension medications:      Sig   benazepril (LOTENSIN) 10 MG tablet TAKE 1 TABLET BY MOUTH ONCE DAILY   sildenafil (VIAGRA) 25 MG tablet TAKE 1-3 TABLETS BY MOUTH AS NEEDED PRIOR TO SEXUAL INTERCOURSE. DO NOT USE MORE THEN 1 DOSE IN 24 HOURS     Well controlled today on 10 mg benazepril. Will refill this for him today. patient has lost about 12 pounds since his last visit, denies any palpitations or dizziness/ passing out sx, I advised he continue to check his BP at home and if he develops any sx of hypotension he should call the office.

## 2022-05-19 ENCOUNTER — Other Ambulatory Visit (HOSPITAL_COMMUNITY): Payer: Self-pay

## 2022-05-19 ENCOUNTER — Other Ambulatory Visit (INDEPENDENT_AMBULATORY_CARE_PROVIDER_SITE_OTHER): Payer: No Typology Code available for payment source

## 2022-05-19 DIAGNOSIS — I10 Essential (primary) hypertension: Secondary | ICD-10-CM | POA: Diagnosis not present

## 2022-05-19 DIAGNOSIS — Z13220 Encounter for screening for lipoid disorders: Secondary | ICD-10-CM

## 2022-05-19 LAB — COMPREHENSIVE METABOLIC PANEL
ALT: 15 U/L (ref 0–53)
AST: 15 U/L (ref 0–37)
Albumin: 4.2 g/dL (ref 3.5–5.2)
Alkaline Phosphatase: 14 U/L — ABNORMAL LOW (ref 39–117)
BUN: 14 mg/dL (ref 6–23)
CO2: 31 mEq/L (ref 19–32)
Calcium: 9.8 mg/dL (ref 8.4–10.5)
Chloride: 102 mEq/L (ref 96–112)
Creatinine, Ser: 0.92 mg/dL (ref 0.40–1.50)
GFR: 87.95 mL/min (ref >60.00)
Glucose, Bld: 106 mg/dL — ABNORMAL HIGH (ref 70–99)
Potassium: 4.7 mEq/L (ref 3.5–5.1)
Sodium: 141 mEq/L (ref 135–145)
Total Bilirubin: 1 mg/dL (ref 0.2–1.2)
Total Protein: 6.8 g/dL (ref 6.0–8.3)

## 2022-05-19 LAB — LIPID PANEL
Cholesterol: 182 mg/dL (ref 0–200)
HDL: 39 mg/dL — ABNORMAL LOW (ref >39.00)
LDL Cholesterol: 119 mg/dL — ABNORMAL HIGH (ref 0–99)
NonHDL: 142.73
Total CHOL/HDL Ratio: 5
Triglycerides: 121 mg/dL (ref 0.0–149.0)
VLDL: 24.2 mg/dL (ref 0.0–40.0)

## 2022-05-27 ENCOUNTER — Other Ambulatory Visit (HOSPITAL_COMMUNITY): Payer: Self-pay

## 2022-06-02 ENCOUNTER — Other Ambulatory Visit (HOSPITAL_COMMUNITY): Payer: Self-pay

## 2022-06-24 ENCOUNTER — Other Ambulatory Visit (HOSPITAL_COMMUNITY): Payer: Self-pay

## 2022-07-04 ENCOUNTER — Telehealth (INDEPENDENT_AMBULATORY_CARE_PROVIDER_SITE_OTHER): Payer: No Typology Code available for payment source | Admitting: Family Medicine

## 2022-07-04 ENCOUNTER — Encounter: Payer: Self-pay | Admitting: Family Medicine

## 2022-07-04 ENCOUNTER — Telehealth: Payer: Self-pay

## 2022-07-04 ENCOUNTER — Other Ambulatory Visit (HOSPITAL_COMMUNITY): Payer: Self-pay

## 2022-07-04 VITALS — Temp 98.0°F

## 2022-07-04 DIAGNOSIS — J0100 Acute maxillary sinusitis, unspecified: Secondary | ICD-10-CM

## 2022-07-04 MED ORDER — METHYLPREDNISOLONE 4 MG PO TBPK
ORAL_TABLET | ORAL | 0 refills | Status: DC
  Filled 2022-07-04: qty 21, 6d supply, fill #0

## 2022-07-04 MED ORDER — DOXYCYCLINE HYCLATE 100 MG PO TABS
100.0000 mg | ORAL_TABLET | Freq: Two times a day (BID) | ORAL | 0 refills | Status: AC
  Filled 2022-07-04: qty 14, 7d supply, fill #0

## 2022-07-04 NOTE — Assessment & Plan Note (Signed)
Severe facial pain, states that there has been no improvement with sudafed or other OTC medications. Will treat with doxycycline 100 mg BID for 7 days (pt is allergic to zithromax) and a medrol dose pak to reduce inflammation

## 2022-07-04 NOTE — Telephone Encounter (Signed)
Caller states he has sinus pain with congestion. Has had symptoms for 2 days. Temp 99. Wants to make appt. cough from post nasal drip.  07/04/2022 7:24:29 AM See PCP within 24 Hours Yes Raphael Gibney, RN, Vanita Ingles  Comments User: Dannielle Burn, RN Date/Time Eilene Ghazi Time): 07/04/2022 7:23:55 AM triage outcome upgraded to see physician within 24 hrs as pt has sinus pain when he touches his face. Please call pt back regarding appt  Referrals REFERRED TO PCP OFFICE  07/04/22 1210 - States he's a little better than he was overnight. Took decongestant & feels a little better. Pt would like appt with Dr Legrand Como. Checked with Dr Legrand Como & approved. VV scheduled with PCP for 2p today.

## 2022-07-04 NOTE — Progress Notes (Signed)
   Established Patient Office Visit  Subjective   Patient ID: Lawrence Wang, male    DOB: February 15, 1956  Age: 66 y.o. MRN: [id number]  Chief Complaint  Patient presents with   Sinus Problem    Patient complains of facial pain and sinus pressure x4 days, tried Tylenol, Sudafed and Mucinex with some relief and also noticed yellow-green nasal drainage after saline rinses   Sore Throat    X3 days   Cough    Productive with clear sputum    I connected with  Mats Jeanlouis on 07/04/22 by a video enabled telemedicine application and verified that I am speaking with the correct person using two identifiers.   I discussed the limitations of evaluation and management by telemedicine. The patient expressed understanding and agreed to proceed.   Patient location: home address Provider location [facility] office  Patient is having worsening sinus pressure despite the use of decongestants, states it feels like "someone has hit him in the face"   Sinus Problem This is a new problem. The current episode started in the past 7 days. The problem is unchanged. There has been no fever. Past treatments include oral decongestants and acetaminophen. The treatment provided no relief.      Review of Systems  All other systems reviewed and are negative.     Objective:     Temp 98 F (36.7 C)    Physical Exam Constitutional:      Appearance: He is well-developed and normal weight.  Eyes:     Conjunctiva/sclera: Conjunctivae normal.  Pulmonary:     Effort: Pulmonary effort is normal.  Neurological:     Mental Status: He is alert and oriented to person, place, and time.  Psychiatric:        Mood and Affect: Mood normal.      No results found for any visits on 07/04/22.    The 10-year ASCVD risk score (Arnett DK, et al., 2019) is: 11.5%    Assessment & Plan:   Problem List Items Addressed This Visit       Respiratory   Acute maxillary sinusitis - Primary    Severe  facial pain, states that there has been no improvement with sudafed or other OTC medications. Will treat with doxycycline 100 mg BID for 7 days (pt is allergic to zithromax) and a medrol dose pak to reduce inflammation      Relevant Medications   methylPREDNISolone (MEDROL DOSEPAK) 4 MG TBPK tablet   doxycycline (VIBRA-TABS) 100 MG tablet   I spent 15 minutes in this video encounter with the patient.  No follow-ups on file.    Farrel Conners, MD

## 2022-07-07 ENCOUNTER — Other Ambulatory Visit (HOSPITAL_COMMUNITY): Payer: Self-pay

## 2022-07-28 ENCOUNTER — Other Ambulatory Visit (HOSPITAL_COMMUNITY): Payer: Self-pay

## 2022-08-08 ENCOUNTER — Other Ambulatory Visit: Payer: Self-pay

## 2022-08-22 ENCOUNTER — Other Ambulatory Visit (HOSPITAL_COMMUNITY): Payer: Self-pay

## 2022-08-22 ENCOUNTER — Encounter: Payer: Self-pay | Admitting: Family Medicine

## 2022-08-22 ENCOUNTER — Telehealth (INDEPENDENT_AMBULATORY_CARE_PROVIDER_SITE_OTHER): Payer: No Typology Code available for payment source | Admitting: Family Medicine

## 2022-08-22 DIAGNOSIS — U071 COVID-19: Secondary | ICD-10-CM | POA: Diagnosis not present

## 2022-08-22 DIAGNOSIS — J019 Acute sinusitis, unspecified: Secondary | ICD-10-CM

## 2022-08-22 MED ORDER — AMOXICILLIN-POT CLAVULANATE 875-125 MG PO TABS
1.0000 | ORAL_TABLET | Freq: Two times a day (BID) | ORAL | 0 refills | Status: DC
  Filled 2022-08-22: qty 20, 10d supply, fill #0

## 2022-08-22 NOTE — Progress Notes (Signed)
Subjective:    Patient ID: Lawrence Wang, male    DOB: September 08, 1955, 66 y.o.   MRN: [id number]  HPI Virtual Visit via Video Note  I connected with the patient on 08/22/22 at  2:00 PM EST by a video enabled telemedicine application and verified that I am speaking with the correct person using two identifiers.  Location patient: home Location provider:work or home office Persons participating in the virtual visit: patient, provider  I discussed the limitations of evaluation and management by telemedicine and the availability of in person appointments. The patient expressed understanding and agreed to proceed.   HPI: Here for what may be the beginning of a sinus infection. He began to feel sick with fever, body aches and a dry cough 2 weeks ago. He then tested positive for the Covid virus on 08-16-22. Earlier this week he began to feel better for a few days, but now he has a lot more sinus congestion and he is blowing green mucus from the nose.    ROS: See pertinent positives and negatives per HPI.  Past Medical History:  Diagnosis Date   GERD (gastroesophageal reflux disease)    Hypertension     Past Surgical History:  Procedure Laterality Date   APPENDECTOMY     INGUINAL HERNIA REPAIR Bilateral 09/23/2021   Procedure: LAPAROSCOPIC BILATERAL INGUINAL HERNIA REPAIR WITH MESH;  Surgeon: Ralene Ok, MD;  Location: [place];  Service: General;  Laterality: Bilateral;   INSERTION OF MESH Bilateral 09/23/2021   Procedure: INSERTION OF MESH;  Surgeon: Ralene Ok, MD;  Location: [place];  Service: General;  Laterality: Bilateral;   NASAL SEPTUM SURGERY     TONSILLECTOMY     UVULECTOMY      Family History  Problem Relation Age of Onset   Cervical cancer Mother 15   High blood pressure Mother    Thyroid disease Mother    Heart block Father    CAD Father 85       +smoker   Lung cancer Brother    Heart attack Maternal Grandfather        age unknown; 71's?   Throat cancer  Paternal Grandmother    Colon polyps Neg Hx    Colon cancer Neg Hx    Esophageal cancer Neg Hx    Rectal cancer Neg Hx    Stomach cancer Neg Hx      Current Outpatient Medications:    amoxicillin-clavulanate (AUGMENTIN) 875-125 MG tablet, Take 1 tablet by mouth 2 (two) times daily., Disp: 20 tablet, Rfl: 0   Apremilast (OTEZLA) 30 MG TABS, Take 1 tablet by mouth twice a day, Disp: 60 tablet, Rfl: 11   benazepril (LOTENSIN) 10 MG tablet, TAKE 1 TABLET BY MOUTH ONCE DAILY, Disp: 90 tablet, Rfl: 3   calcipotriene (DOVONOX) 0.005 % ointment, Apply small amount to skin twice a day, Disp: 60 g, Rfl: 11   Flaxseed, Linseed, (FLAX SEEDS PO), Take 1 capsule by mouth daily., Disp: , Rfl:    meloxicam (MOBIC) 7.5 MG tablet, Take 1 - 2 tablets by mouth daily as needed for pain., Disp: 90 tablet, Rfl: 1   Multiple Vitamin (MULTIVITAMIN) capsule, Take 1 capsule by mouth daily., Disp: , Rfl:    omeprazole (PRILOSEC) 20 MG capsule, Take 1 capsule (20 mg total) by mouth every other day. (Patient taking differently: Take 20 mg by mouth daily.), Disp: , Rfl:    traMADol (ULTRAM) 50 MG tablet, Take 1 tablet (50 mg total) by mouth every  6 (six) hours as needed., Disp: 20 tablet, Rfl: 0   sildenafil (VIAGRA) 25 MG tablet, TAKE 1-3 TABLETS BY MOUTH AS NEEDED PRIOR TO SEXUAL INTERCOURSE. DO NOT USE MORE THEN 1 DOSE IN 24 HOURS, Disp: 6 tablet, Rfl: 5  EXAM:  VITALS per patient if applicable:  GENERAL: alert, oriented, appears well and in no acute distress  HEENT: atraumatic, conjunttiva clear, no obvious abnormalities on inspection of external nose and ears  NECK: normal movements of the head and neck  LUNGS: on inspection no signs of respiratory distress, breathing rate appears normal, no obvious gross SOB, gasping or wheezing  CV: no obvious cyanosis  MS: moves all visible extremities without noticeable abnormality  PSYCH/NEURO: pleasant and cooperative, no obvious depression or anxiety, speech and  thought processing grossly intact  ASSESSMENT AND PLAN: This is likely a sinusitis secondary to the Covid infection. We will treat with 10 days of Augmentin. He may add Mucinex as needed. Alysia Penna, MD  Discussed the following assessment and plan:  No diagnosis found.     I discussed the assessment and treatment plan with the patient. The patient was provided an opportunity to ask questions and all were answered. The patient agreed with the plan and demonstrated an understanding of the instructions.   The patient was advised to call back or seek an in-person evaluation if the symptoms worsen or if the condition fails to improve as anticipated.      Review of Systems     Objective:   Physical Exam        Assessment & Plan:

## 2022-08-29 ENCOUNTER — Other Ambulatory Visit: Payer: Self-pay

## 2022-09-01 ENCOUNTER — Other Ambulatory Visit: Payer: Self-pay

## 2022-09-04 ENCOUNTER — Other Ambulatory Visit (HOSPITAL_COMMUNITY): Payer: Self-pay

## 2022-09-08 ENCOUNTER — Other Ambulatory Visit (HOSPITAL_COMMUNITY): Payer: Self-pay

## 2022-09-10 DIAGNOSIS — B360 Pityriasis versicolor: Secondary | ICD-10-CM | POA: Diagnosis not present

## 2022-09-10 DIAGNOSIS — L400 Psoriasis vulgaris: Secondary | ICD-10-CM | POA: Diagnosis not present

## 2022-09-11 ENCOUNTER — Other Ambulatory Visit: Payer: Self-pay

## 2022-09-15 ENCOUNTER — Other Ambulatory Visit (HOSPITAL_COMMUNITY): Payer: Self-pay

## 2022-09-15 ENCOUNTER — Other Ambulatory Visit: Payer: Self-pay

## 2022-09-17 ENCOUNTER — Encounter: Payer: Self-pay | Admitting: Family Medicine

## 2022-09-17 ENCOUNTER — Ambulatory Visit (INDEPENDENT_AMBULATORY_CARE_PROVIDER_SITE_OTHER): Payer: Self-pay | Admitting: Family Medicine

## 2022-09-17 VITALS — BP 130/76 | Ht 75.0 in | Wt 215.0 lb

## 2022-09-17 DIAGNOSIS — H2513 Age-related nuclear cataract, bilateral: Secondary | ICD-10-CM | POA: Diagnosis not present

## 2022-09-17 DIAGNOSIS — H43813 Vitreous degeneration, bilateral: Secondary | ICD-10-CM | POA: Diagnosis not present

## 2022-09-17 DIAGNOSIS — H532 Diplopia: Secondary | ICD-10-CM | POA: Diagnosis not present

## 2022-09-17 DIAGNOSIS — U071 COVID-19: Secondary | ICD-10-CM

## 2022-09-17 NOTE — Progress Notes (Signed)
PCP: Farrel Conners, MD  Subjective:   HPI: Patient is a 67 y.o. male here for covid clearance for diving.  Patient had COVID back in December. This is the second time he's had this - noted mainly sinus congestion, low grade fever, fatigue, cough with some sputum.  No shortness of breath. Has been back to regular activities past couple weeks without difficulty - no chest pain, shortness of breath, unusual fatigue. Forms again reviewed and filled out with extensive past medical history only positive for hypertension and some hearing loss.  Past Medical History:  Diagnosis Date   GERD (gastroesophageal reflux disease)    Hypertension     Current Outpatient Medications on File Prior to Visit  Medication Sig Dispense Refill   amoxicillin-clavulanate (AUGMENTIN) 875-125 MG tablet Take 1 tablet by mouth 2 (two) times daily. 20 tablet 0   Apremilast (OTEZLA) 30 MG TABS Take 1 tablet by mouth twice a day 60 tablet 11   benazepril (LOTENSIN) 10 MG tablet TAKE 1 TABLET BY MOUTH ONCE DAILY 90 tablet 3   calcipotriene (DOVONOX) 0.005 % ointment Apply small amount to skin twice a day 60 g 11   Flaxseed, Linseed, (FLAX SEEDS PO) Take 1 capsule by mouth daily.     meloxicam (MOBIC) 7.5 MG tablet Take 1 - 2 tablets by mouth daily as needed for pain. 90 tablet 1   Multiple Vitamin (MULTIVITAMIN) capsule Take 1 capsule by mouth daily.     omeprazole (PRILOSEC) 20 MG capsule Take 1 capsule (20 mg total) by mouth every other day. (Patient taking differently: Take 20 mg by mouth daily.)     sildenafil (VIAGRA) 25 MG tablet TAKE 1-3 TABLETS BY MOUTH AS NEEDED PRIOR TO SEXUAL INTERCOURSE. DO NOT USE MORE THEN 1 DOSE IN 24 HOURS 6 tablet 5   traMADol (ULTRAM) 50 MG tablet Take 1 tablet (50 mg total) by mouth every 6 (six) hours as needed. 20 tablet 0   No current facility-administered medications on file prior to visit.    Past Surgical History:  Procedure Laterality Date   APPENDECTOMY      INGUINAL HERNIA REPAIR Bilateral 09/23/2021   Procedure: LAPAROSCOPIC BILATERAL INGUINAL HERNIA REPAIR WITH MESH;  Surgeon: Ralene Ok, MD;  Location: [place];  Service: General;  Laterality: Bilateral;   INSERTION OF MESH Bilateral 09/23/2021   Procedure: INSERTION OF MESH;  Surgeon: Ralene Ok, MD;  Location: [place];  Service: General;  Laterality: Bilateral;   NASAL SEPTUM SURGERY     TONSILLECTOMY     UVULECTOMY      Allergies  Allergen Reactions   Peanuts [Peanut Oil] Other (See Comments)    Headaches   Zithromax [Azithromycin] Other (See Comments)    Hiccups-per patient    BP 130/76   Ht '6\' 3"'$  (1.905 m)   Wt 215 lb (97.5 kg)   BMI 26.87 kg/m       No data to display              No data to display              Objective:  Physical Exam:  Gen: NAD, comfortable in exam room  CV: RRR no MRG seated or standing. Chest: expands symmetrically. Lungs: CTAB without wheezes, rales, rhonchi all fields.   Assessment & Plan:  1. COVID - patient had mild symptoms without anything concerning.  History benign and exam also reassuring.  Cleared for diving without restrictions.  Forms completed.

## 2022-10-01 ENCOUNTER — Other Ambulatory Visit (HOSPITAL_COMMUNITY): Payer: Self-pay

## 2022-10-08 ENCOUNTER — Other Ambulatory Visit: Payer: Self-pay

## 2022-10-09 ENCOUNTER — Other Ambulatory Visit (HOSPITAL_COMMUNITY): Payer: Self-pay

## 2022-10-09 ENCOUNTER — Other Ambulatory Visit: Payer: Self-pay

## 2022-10-13 ENCOUNTER — Other Ambulatory Visit (HOSPITAL_COMMUNITY): Payer: Self-pay

## 2022-10-14 ENCOUNTER — Other Ambulatory Visit (HOSPITAL_COMMUNITY): Payer: Self-pay

## 2022-10-30 ENCOUNTER — Other Ambulatory Visit (HOSPITAL_COMMUNITY): Payer: Self-pay

## 2022-11-03 ENCOUNTER — Other Ambulatory Visit (HOSPITAL_COMMUNITY): Payer: Self-pay

## 2022-11-10 ENCOUNTER — Other Ambulatory Visit: Payer: Self-pay

## 2022-11-10 ENCOUNTER — Telehealth: Payer: Self-pay | Admitting: Pharmacist

## 2022-11-10 NOTE — Telephone Encounter (Signed)
Called patient to schedule an appointment for the [employer] [facility]. I was unable to reach the patient so I left a HIPAA-compliant message requesting that the patient return my call.   Luke Van Ausdall, PharmD, BCACP, CPP Clinical Pharmacist [facility] [phone]  

## 2022-11-11 ENCOUNTER — Other Ambulatory Visit: Payer: Self-pay

## 2022-11-11 ENCOUNTER — Other Ambulatory Visit (HOSPITAL_COMMUNITY): Payer: Self-pay

## 2022-11-12 ENCOUNTER — Other Ambulatory Visit (HOSPITAL_COMMUNITY): Payer: Self-pay

## 2022-11-14 ENCOUNTER — Other Ambulatory Visit: Payer: Self-pay

## 2022-11-17 ENCOUNTER — Other Ambulatory Visit (HOSPITAL_COMMUNITY): Payer: Self-pay

## 2022-11-19 ENCOUNTER — Other Ambulatory Visit (HOSPITAL_COMMUNITY): Payer: Self-pay

## 2022-11-20 ENCOUNTER — Other Ambulatory Visit (HOSPITAL_COMMUNITY): Payer: Self-pay

## 2022-11-20 IMAGING — DX DG CHEST 2V
2 series · 2 of 2 positions shown · non-contrast
Comparison: 05/16/2019 chest radiograph.

CLINICAL DATA: Dive physical

EXAM:
CHEST - 2 VIEW

[dg chest 2 view (1 of 2)]
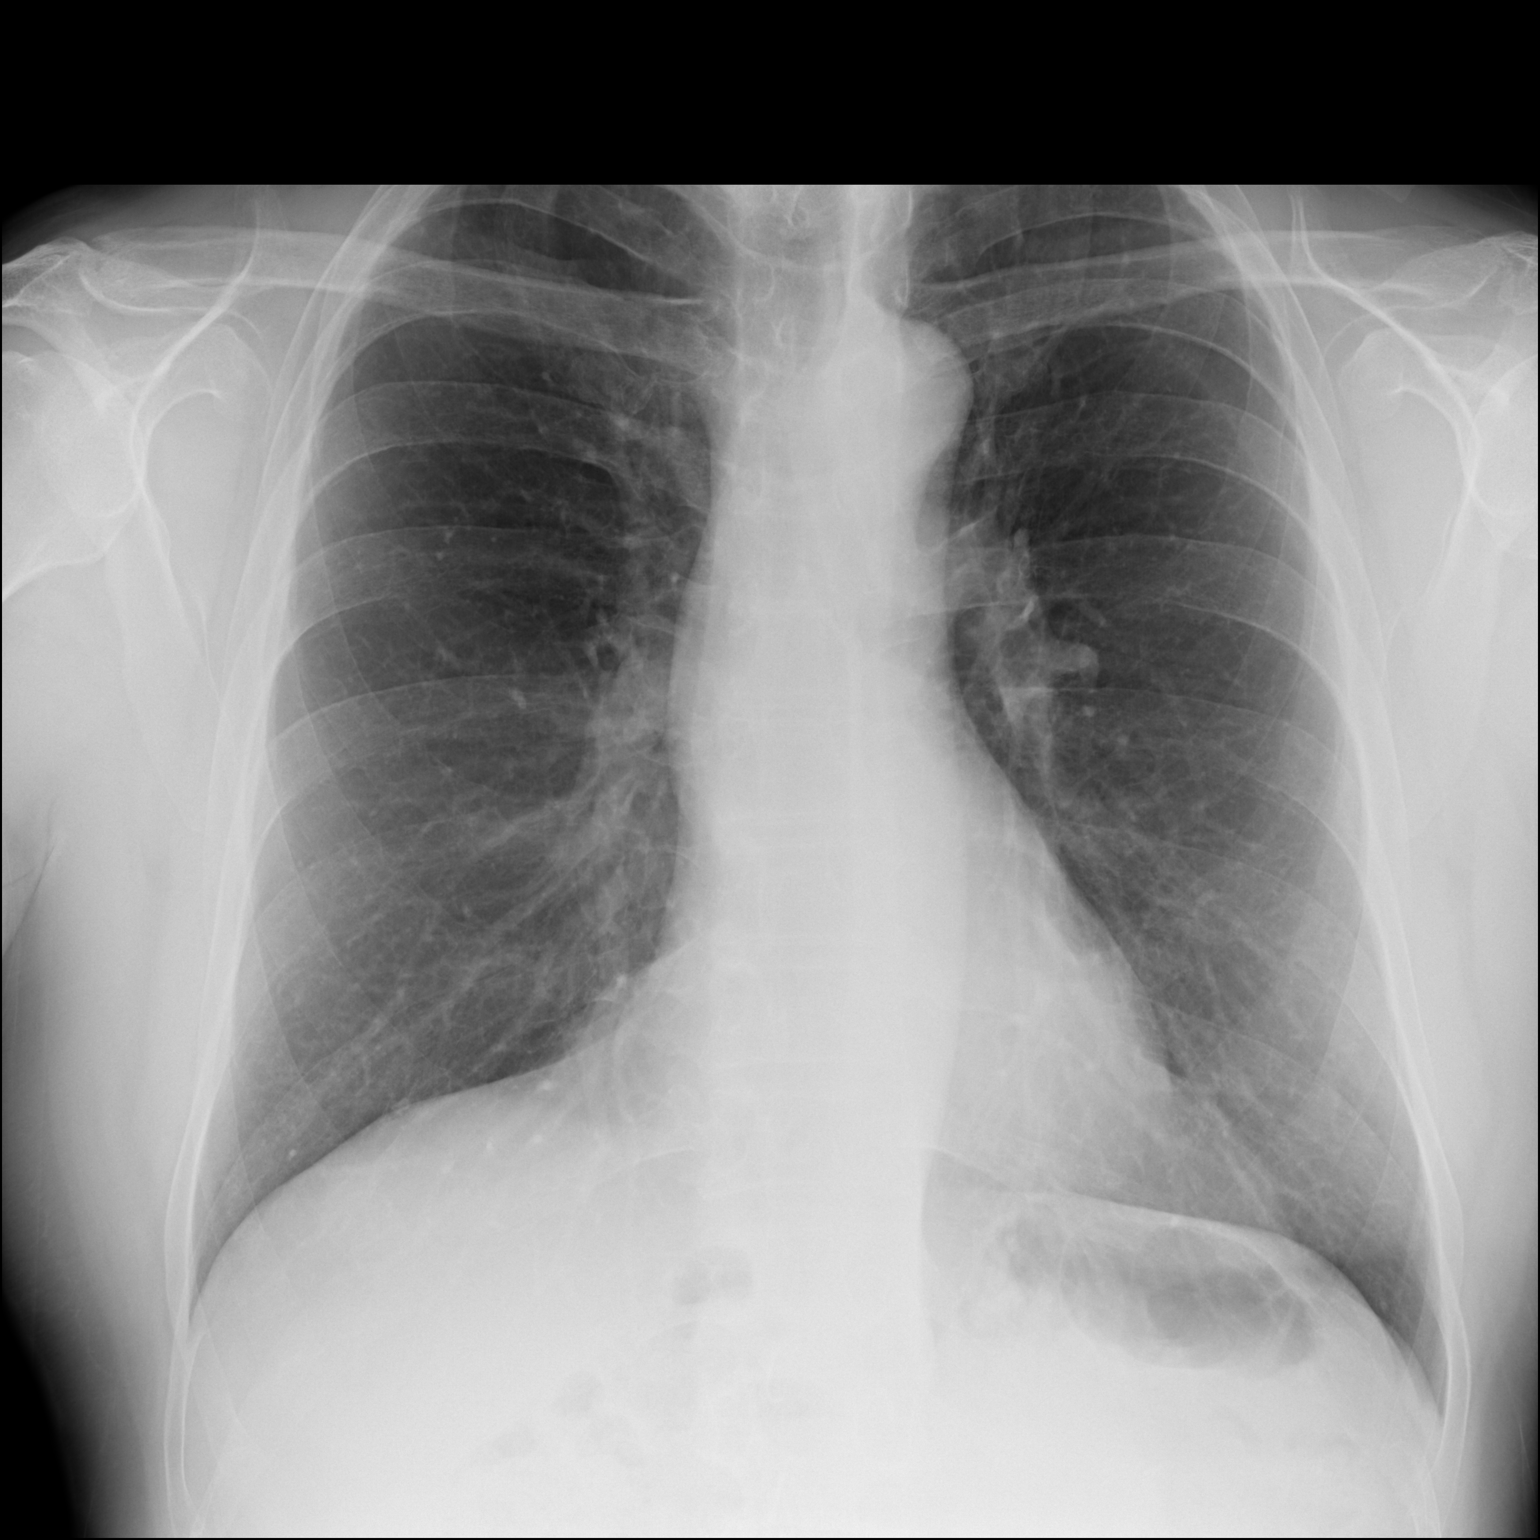

[dg chest 2 view (2 of 2)]
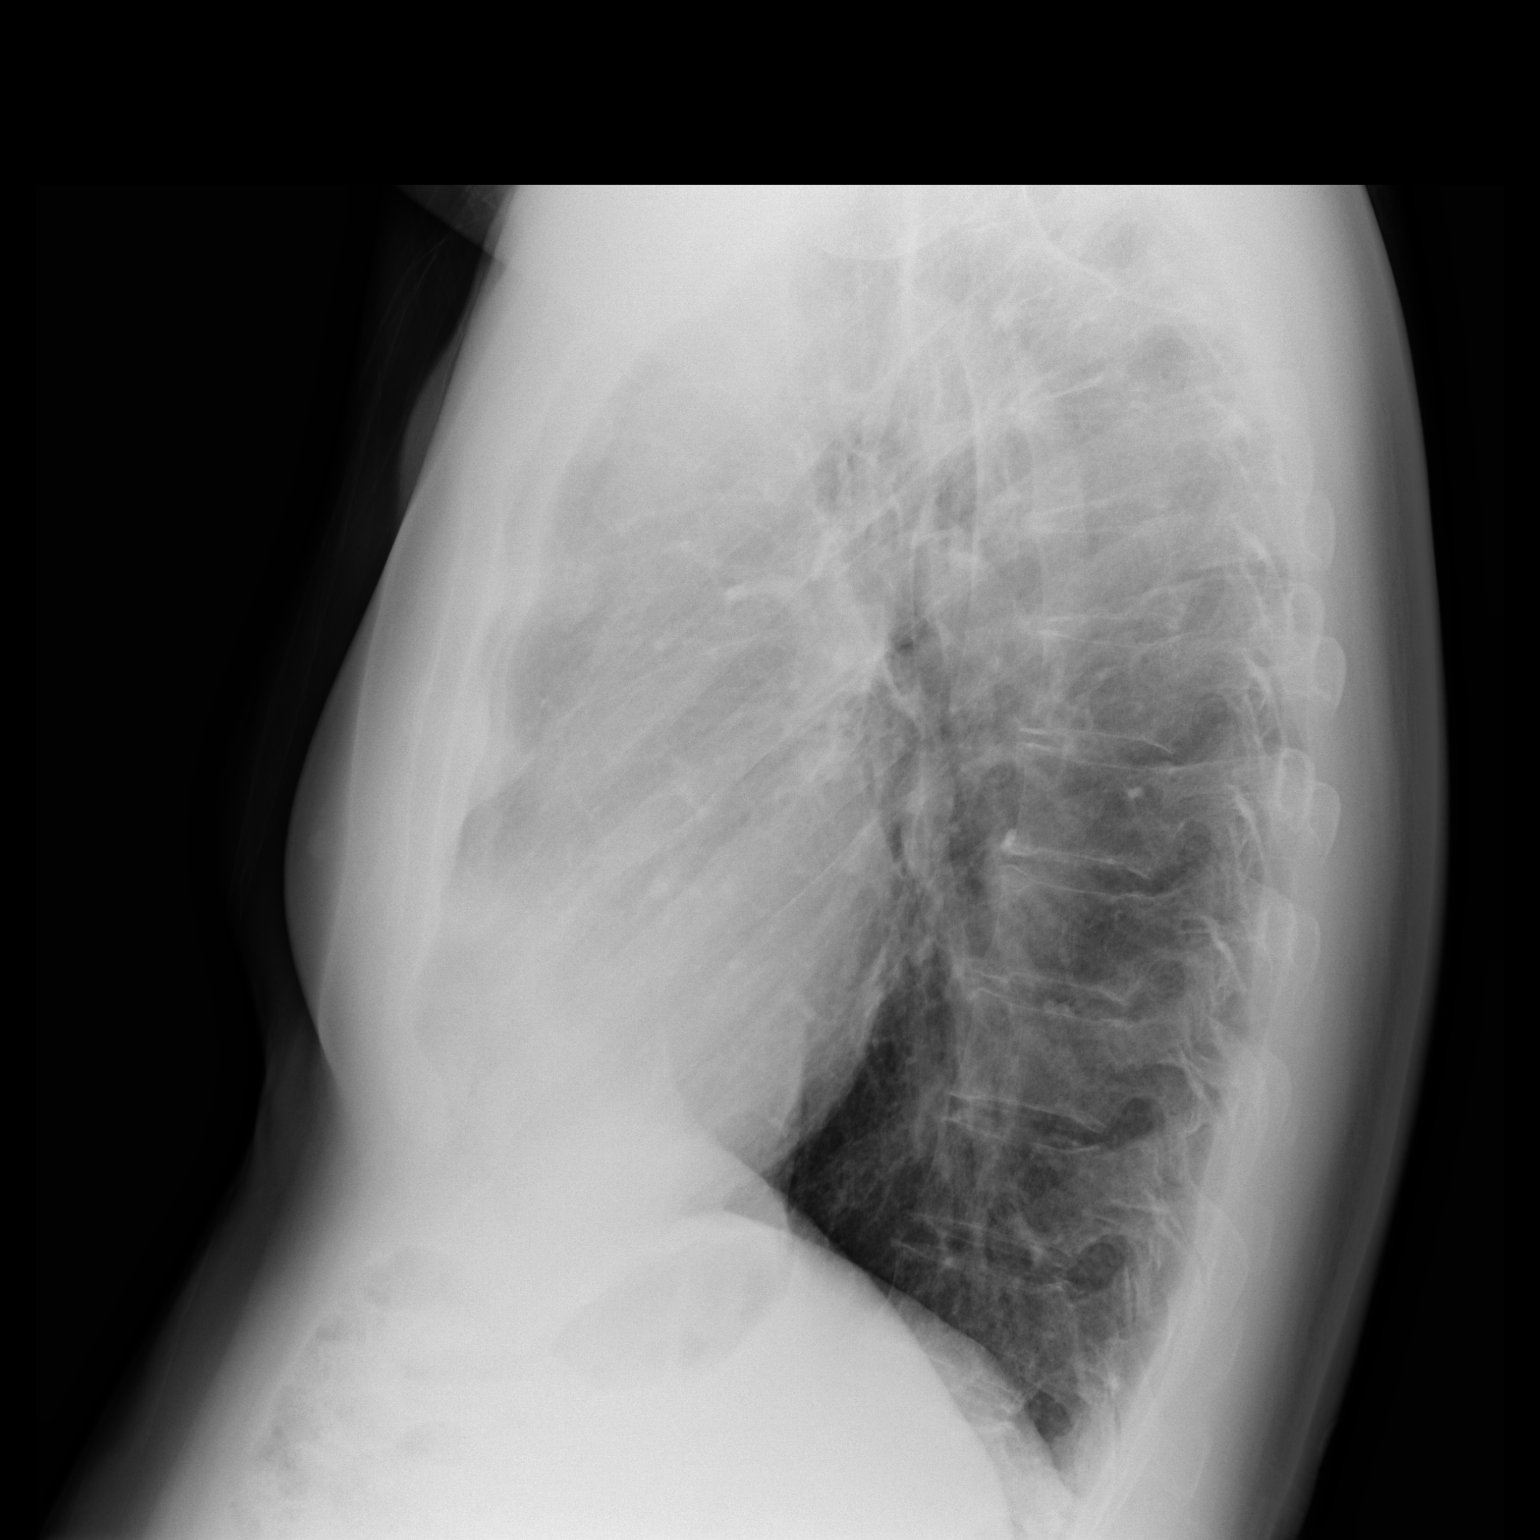

[2 of 2 positions shown; findings below may reference images not displayed]

FINDINGS: Stable cardiomediastinal silhouette with normal heart size. No
pneumothorax. No pleural effusion. Lungs appear clear, with no acute
consolidative airspace disease and no pulmonary edema.
IMPRESSION: No active cardiopulmonary disease.

## 2022-11-21 ENCOUNTER — Other Ambulatory Visit (HOSPITAL_COMMUNITY): Payer: Self-pay

## 2022-12-09 ENCOUNTER — Ambulatory Visit: Payer: 59 | Attending: Family Medicine | Admitting: Pharmacist

## 2022-12-09 ENCOUNTER — Other Ambulatory Visit: Payer: Self-pay

## 2022-12-09 ENCOUNTER — Other Ambulatory Visit (HOSPITAL_COMMUNITY): Payer: Self-pay

## 2022-12-09 DIAGNOSIS — Z79899 Other long term (current) drug therapy: Secondary | ICD-10-CM

## 2022-12-09 MED ORDER — OTEZLA 30 MG PO TABS: 1.0000 | ORAL_TABLET | Freq: Two times a day (BID) | ORAL | 11 refills | Status: DC

## 2022-12-09 MED ORDER — OTEZLA 30 MG PO TABS
1.0000 | ORAL_TABLET | Freq: Two times a day (BID) | ORAL | 11 refills | Status: DC
  Filled 2022-12-09: qty 60, 30d supply, fill #0
  Filled 2023-01-20: qty 60, 30d supply, fill #1
  Filled 2023-02-10: qty 60, 30d supply, fill #2
  Filled 2023-03-16: qty 60, 30d supply, fill #3
  Filled 2023-04-15: qty 60, 30d supply, fill #4
  Filled 2023-05-07: qty 60, 30d supply, fill #5
  Filled 2023-06-10: qty 60, 30d supply, fill #6
  Filled 2023-07-09: qty 60, 30d supply, fill #7
  Filled 2023-08-10: qty 60, 30d supply, fill #8
  Filled 2023-09-08: qty 60, 30d supply, fill #9
  Filled 2023-10-16: qty 60, 30d supply, fill #10
  Filled 2023-11-20: qty 60, 30d supply, fill #11

## 2022-12-09 NOTE — Progress Notes (Signed)
  S: Patient presents for review of their specialty medication therapy.  Patient is taking Otezla for psoriasis. Patient is managed by Dr. Doreen Beam for this.   Adherence: confirms.  Efficacy: confirms.   Dosing:  Active psoriatic arthritis or plaque psoriasis (moderate to severe): Oral: Initial: 10 mg in the morning. Titrate upward by additional 10 mg per day on days 2 to 5 as follows: Day 2: 10 mg twice daily; Day 3: 10 mg in the morning and 20 mg in the evening; Day 4: 20 mg twice daily; Day 5: 20 mg in the morning and 30 mg in the evening. Maintenance dose: 30 mg twice daily starting on day 6  CrCl <30 mL/minute: Initial: 10 mg in the morning on days 1 to 3; titrate using morning doses only (skip evening doses) to 20 mg on days 4 and 5. Maintenance dose: 30 mg once daily in the morning starting on day 6.    Current adverse effects: Headache: none GI upset: some but nothing unmanageable at this time.  Weight loss: none Neuropsychiatric effects: none  O:     Lab Results  Component Value Date   WBC 5.1 09/02/2021   HGB 15.6 09/02/2021   HCT 47.2 09/02/2021   MCV 89.3 09/02/2021   PLT 211.0 09/02/2021      Chemistry      Component Value Date/Time   NA 141 05/19/2022 0853   K 4.7 05/19/2022 0853   CL 102 05/19/2022 0853   CO2 31 05/19/2022 0853   BUN 14 05/19/2022 0853   CREATININE 0.92 05/19/2022 0853   CREATININE 0.88 03/30/2020 0731      Component Value Date/Time   CALCIUM 9.8 05/19/2022 0853   ALKPHOS 14 (L) 05/19/2022 0853   AST 15 05/19/2022 0853   ALT 15 05/19/2022 0853   BILITOT 1.0 05/19/2022 0853       A/P: 1. Medication review: patient is taking Otezla for psoriasis. Reviewed the medication including the following: apremilast inhibits phosphodiesterase 4 (PDE4) specific for cyclic adenosine monophosphate (cAMP) which results in increased intracellular cAMP levels and regulation of numerous inflammatory mediators (eg, decreased expression of nitric  oxide synthase, TNF-alpha, and interleukin [IL]-23, as well as increased IL-10. Patient educated on purpose, proper use and potential adverse effects of Otezla. Possible adverse effects include weight loss, GI upset, headache, and mood changes. Renal function should be routinely monitored. Administer without regard to food. Do not crush, chew, or split tablets. No recommendations for any changes at this time.  Butch Penny, PharmD, Patsy [place], CPP Clinical Pharmacist [facility] [phone]

## 2022-12-11 ENCOUNTER — Other Ambulatory Visit (HOSPITAL_COMMUNITY): Payer: Self-pay

## 2023-01-08 ENCOUNTER — Other Ambulatory Visit (HOSPITAL_COMMUNITY): Payer: Self-pay

## 2023-02-10 ENCOUNTER — Other Ambulatory Visit (HOSPITAL_COMMUNITY): Payer: Self-pay

## 2023-02-13 ENCOUNTER — Other Ambulatory Visit (HOSPITAL_COMMUNITY): Payer: Self-pay

## 2023-02-16 ENCOUNTER — Other Ambulatory Visit: Payer: Self-pay

## 2023-03-12 ENCOUNTER — Other Ambulatory Visit (HOSPITAL_COMMUNITY): Payer: Self-pay

## 2023-03-16 ENCOUNTER — Other Ambulatory Visit (HOSPITAL_COMMUNITY): Payer: Self-pay

## 2023-03-20 ENCOUNTER — Other Ambulatory Visit: Payer: Self-pay

## 2023-03-23 ENCOUNTER — Other Ambulatory Visit (HOSPITAL_COMMUNITY): Payer: Self-pay

## 2023-03-30 ENCOUNTER — Other Ambulatory Visit (INDEPENDENT_AMBULATORY_CARE_PROVIDER_SITE_OTHER): Payer: 59 | Admitting: *Deleted

## 2023-03-30 DIAGNOSIS — Z0189 Encounter for other specified special examinations: Secondary | ICD-10-CM | POA: Diagnosis not present

## 2023-04-15 ENCOUNTER — Other Ambulatory Visit (HOSPITAL_COMMUNITY): Payer: Self-pay

## 2023-04-16 ENCOUNTER — Other Ambulatory Visit (HOSPITAL_COMMUNITY): Payer: Self-pay

## 2023-04-20 ENCOUNTER — Encounter: Payer: Self-pay | Admitting: Internal Medicine

## 2023-04-20 ENCOUNTER — Other Ambulatory Visit (HOSPITAL_COMMUNITY): Payer: Self-pay

## 2023-04-20 ENCOUNTER — Ambulatory Visit (INDEPENDENT_AMBULATORY_CARE_PROVIDER_SITE_OTHER): Payer: 59 | Admitting: Internal Medicine

## 2023-04-20 VITALS — BP 120/78 | HR 70 | Temp 98.6°F | Wt 225.1 lb

## 2023-04-20 DIAGNOSIS — Z23 Encounter for immunization: Secondary | ICD-10-CM

## 2023-04-20 DIAGNOSIS — M25561 Pain in right knee: Secondary | ICD-10-CM

## 2023-04-20 MED ORDER — MELOXICAM 7.5 MG PO TABS
7.5000 mg | ORAL_TABLET | Freq: Every day | ORAL | 0 refills | Status: DC
Start: 2023-04-20 — End: 2023-08-07
  Filled 2023-04-20: qty 30, 30d supply, fill #0

## 2023-04-20 NOTE — Progress Notes (Signed)
Established Patient Office Visit     CC/Reason for Visit: Right knee pain  HPI: Lawrence Wang is a 67 y.o. male who is coming in today for the above mentioned reasons.  For a couple weeks he has been experiencing right knee pain.  Pain is in the medial aspect of the knee on the lower joint line.  Going up and down steps has been particularly excruciating.  He does not describe a whole lot of swelling.  No preceding injury.  Exercising seems to aggravate it.   Past Medical/Surgical History: Past Medical History:  Diagnosis Date   GERD (gastroesophageal reflux disease)    Hypertension     Past Surgical History:  Procedure Laterality Date   APPENDECTOMY     INGUINAL HERNIA REPAIR Bilateral 09/23/2021   Procedure: LAPAROSCOPIC BILATERAL INGUINAL HERNIA REPAIR WITH MESH;  Surgeon: Axel Filler, MD;  Location: [facility];  Service: General;  Laterality: Bilateral;   INSERTION OF MESH Bilateral 09/23/2021   Procedure: INSERTION OF MESH;  Surgeon: Axel Filler, MD;  Location: [facility] OR;  Service: General;  Laterality: Bilateral;   NASAL SEPTUM SURGERY     TONSILLECTOMY     UVULECTOMY      Social History:  reports that he has never smoked. He has never used smokeless tobacco. He reports current alcohol use. He reports that he does not use drugs.  Allergies: Allergies  Allergen Reactions   Peanuts [Peanut Oil] Other (See Comments)    Headaches   Zithromax [Azithromycin] Other (See Comments)    Hiccups-per patient    Family History:  Family History  Problem Relation Age of Onset   Cervical cancer Mother 36   High blood pressure Mother    Thyroid disease Mother    Heart block Father    CAD Father 75       +smoker   Lung cancer Brother    Heart attack Maternal Grandfather        age unknown; 23's?   Throat cancer Paternal Grandmother    Colon polyps Neg Hx    Colon cancer Neg Hx    Esophageal cancer Neg Hx    Rectal cancer Neg Hx    Stomach cancer Neg Hx       Current Outpatient Medications:    Apremilast (OTEZLA) 30 MG TABS, Take 1 tablet by mouth twice a day, Disp: 60 tablet, Rfl: 11   benazepril (LOTENSIN) 10 MG tablet, TAKE 1 TABLET BY MOUTH ONCE DAILY, Disp: 90 tablet, Rfl: 3   calcipotriene (DOVONOX) 0.005 % ointment, Apply small amount to skin twice a day, Disp: 60 g, Rfl: 11   Flaxseed, Linseed, (FLAX SEEDS PO), Take 1 capsule by mouth daily., Disp: , Rfl:    meloxicam (MOBIC) 7.5 MG tablet, Take 1 tablet (7.5 mg total) by mouth daily., Disp: 30 tablet, Rfl: 0   Multiple Vitamin (MULTIVITAMIN) capsule, Take 1 capsule by mouth daily., Disp: , Rfl:    omeprazole (PRILOSEC) 20 MG capsule, Take 1 capsule (20 mg total) by mouth every other day. (Patient taking differently: Take 20 mg by mouth daily.), Disp: , Rfl:    sildenafil (VIAGRA) 25 MG tablet, TAKE 1-3 TABLETS BY MOUTH AS NEEDED PRIOR TO SEXUAL INTERCOURSE. DO NOT USE MORE THEN 1 DOSE IN 24 HOURS, Disp: 6 tablet, Rfl: 5  Review of Systems:  Negative unless indicated in HPI.   Physical Exam: Vitals:   04/20/23 1056  BP: 120/78  Pulse: 70  Temp: 98.6 F (37  C)  TempSrc: Oral  SpO2: 98%  Weight: 225 lb 1.6 oz (102.1 kg)    Body mass index is 28.14 kg/m.   Physical Exam Musculoskeletal:     Comments: Pain on the medial side of the right lower knee joint line      Impression and Plan:  Acute pain of right knee -     Meloxicam; Take 1 tablet (7.5 mg total) by mouth daily.  Dispense: 30 tablet; Refill: 0  Immunization due   -Flu vaccine administered today. -Suspect this may be Pes anserine bursitis.  He does have what appears to be a small joint effusion so osteoarthritis is not completely excluded or a medial meniscus tear although that would be unlikely without a known injury.  I have advised bracing, icing, will prescribe some meloxicam.  If no improvement will consider orthopedics referral.  Time spent:30 minutes reviewing chart, interviewing and examining  patient and formulating plan of care.     Chaya Jan, MD [facility]

## 2023-04-20 NOTE — Addendum Note (Signed)
Addended by: Kern Reap B on: 04/20/2023 11:44 AM   Modules accepted: Orders

## 2023-04-29 ENCOUNTER — Ambulatory Visit: Admission: RE | Admit: 2023-04-29 | Payer: 59 | Source: Ambulatory Visit

## 2023-04-29 ENCOUNTER — Encounter: Payer: Self-pay | Admitting: Family Medicine

## 2023-04-29 ENCOUNTER — Other Ambulatory Visit (INDEPENDENT_AMBULATORY_CARE_PROVIDER_SITE_OTHER): Payer: 59

## 2023-04-29 ENCOUNTER — Ambulatory Visit (INDEPENDENT_AMBULATORY_CARE_PROVIDER_SITE_OTHER): Payer: 59 | Admitting: Family Medicine

## 2023-04-29 VITALS — BP 130/70 | Ht 75.0 in | Wt 220.0 lb

## 2023-04-29 DIAGNOSIS — Z0189 Encounter for other specified special examinations: Secondary | ICD-10-CM

## 2023-04-29 DIAGNOSIS — I498 Other specified cardiac arrhythmias: Secondary | ICD-10-CM | POA: Diagnosis not present

## 2023-04-29 DIAGNOSIS — I10 Essential (primary) hypertension: Secondary | ICD-10-CM | POA: Diagnosis not present

## 2023-04-29 NOTE — Progress Notes (Signed)
PCP: Karie Georges, MD  Subjective:   HPI: Patient is a 67 y.o. male here for dive physical.  Last dive physical 05/09/21 - cleared without issues; he did have COVID with mild symptoms and cleared 09/17/22 for diving. Patient's medical history positive for hypertension, psoriasis, hearing loss (uses hearing aids) Patient denies history of seizures, barotrauma, pneumothorax, chronic sinus problems, asthma, panic attacks. No history of neurologic disorders, cancer.  Past Medical History:  Diagnosis Date   GERD (gastroesophageal reflux disease)    Hypertension     Current Outpatient Medications on File Prior to Visit  Medication Sig Dispense Refill   Apremilast (OTEZLA) 30 MG TABS Take 1 tablet by mouth twice a day 60 tablet 11   benazepril (LOTENSIN) 10 MG tablet TAKE 1 TABLET BY MOUTH ONCE DAILY 90 tablet 3   calcipotriene (DOVONOX) 0.005 % ointment Apply small amount to skin twice a day 60 g 11   Flaxseed, Linseed, (FLAX SEEDS PO) Take 1 capsule by mouth daily.     meloxicam (MOBIC) 7.5 MG tablet Take 1 tablet (7.5 mg total) by mouth daily. 30 tablet 0   Multiple Vitamin (MULTIVITAMIN) capsule Take 1 capsule by mouth daily.     omeprazole (PRILOSEC) 20 MG capsule Take 1 capsule (20 mg total) by mouth every other day. (Patient taking differently: Take 20 mg by mouth daily.)     sildenafil (VIAGRA) 25 MG tablet TAKE 1-3 TABLETS BY MOUTH AS NEEDED PRIOR TO SEXUAL INTERCOURSE. DO NOT USE MORE THEN 1 DOSE IN 24 HOURS 6 tablet 5   No current facility-administered medications on file prior to visit.    Past Surgical History:  Procedure Laterality Date   APPENDECTOMY     INGUINAL HERNIA REPAIR Bilateral 09/23/2021   Procedure: LAPAROSCOPIC BILATERAL INGUINAL HERNIA REPAIR WITH MESH;  Surgeon: Axel Filler, MD;  Location: [facility] OR;  Service: General;  Laterality: Bilateral;   INSERTION OF MESH Bilateral 09/23/2021   Procedure: INSERTION OF MESH;  Surgeon: Axel Filler, MD;   Location: [facility] OR;  Service: General;  Laterality: Bilateral;   NASAL SEPTUM SURGERY     TONSILLECTOMY     UVULECTOMY      Allergies  Allergen Reactions   Peanuts [Peanut Oil] Other (See Comments)    Headaches   Zithromax [Azithromycin] Other (See Comments)    Hiccups-per patient    Social History   Socioeconomic History   Marital status: Married    Spouse name: Not on file   Number of children: Not on file   Years of education: Not on file   Highest education level: Master's degree (e.g., MA, MS, MEng, MEd, MSW, MBA)  Occupational History   Not on file  Tobacco Use   Smoking status: Never   Smokeless tobacco: Never  Vaping Use   Vaping status: Never Used  Substance and Sexual Activity   Alcohol use: Yes    Alcohol/week: 0.0 standard drinks of alcohol    Comment: glass of wine 4 nights per week   Drug use: No   Sexual activity: Not on file  Other Topics Concern   Not on file  Social History Narrative   Work or School: Scientist, research (physical sciences), 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      Home Situation: lives with wife      Spiritual Beliefs: protestant      Lifestyle: regular exercise, diet not great      Social Determinants of Corporate investment banker Strain: Low  Risk  (06/28/2021)   Overall Financial Resource Strain (CARDIA)    Difficulty of Paying Living Expenses: Not hard at all  Food Insecurity: No Food Insecurity (06/28/2021)   Hunger Vital Sign    Worried About Running Out of Food in the Last Year: Never true    Ran Out of Food in the Last Year: Never true  Transportation Needs: No Transportation Needs (06/28/2021)   PRAPARE - Administrator, Civil Service (Medical): No    Lack of Transportation (Non-Medical): No  Physical Activity: Insufficiently Active (06/28/2021)   Exercise Vital Sign    Days of Exercise per Week: 3 days    Minutes of Exercise per Session: 30 min  Stress: No Stress Concern Present (06/28/2021)   [employer] of Occupational Health - Occupational Stress Questionnaire    Feeling of Stress : Only a little  Social Connections: Socially Integrated (06/28/2021)   Social Connection and Isolation Panel [NHANES]    Frequency of Communication with Friends and Family: Three times a week    Frequency of Social Gatherings with Friends and Family: Once a week    Attends Religious Services: More than 4 times per year    Active Member of [employer] or Organizations: Yes    Attends Engineer, structural: More than 4 times per year    Marital Status: Married  Catering manager Violence: Not on file    Family History  Problem Relation Age of Onset   Cervical cancer Mother 79   High blood pressure Mother    Thyroid disease Mother    Heart block Father    CAD Father 84       +smoker   Lung cancer Brother    Heart attack Maternal Grandfather        age unknown; 53's?   Throat cancer Paternal Grandmother    Colon polyps Neg Hx    Colon cancer Neg Hx    Esophageal cancer Neg Hx    Rectal cancer Neg Hx    Stomach cancer Neg Hx     BP 130/70   Ht 6\' 3"  (1.905 m)   Wt 220 lb (99.8 kg)   BMI 27.50 kg/m       No data to display              No data to display          Review of Systems: See HPI above.     Objective:  Physical Exam:  Gen: NAD, comfortable in exam room  HEENT: PERRL, EOMI.  Visual fields full.  TMs and pharynx normal. CV: Regular rate.  Rhythm with occasional skipped beat. no MRG Lungs: CTAB without wheezes, rales, or rhonchi   CXR: normal without abnormalities. EKG: multiple PVCs but otherwise normal.  2 seen on EKG - 13/minute on rhythm strip Hb: 15.3 Glucose: 130 Urine protein and glucose: negative  Assessment & Plan:  1. Dive physical - EKG with 2 PVCs, 13/minute on rhythm strip.  Discussed increased association of organic heart disease when this number of PVCs seen requiring cardiac workup before clearance.  Will refer to cardiology.  If workup  negative, he will be cleared for diving without restrictions.

## 2023-05-07 ENCOUNTER — Other Ambulatory Visit (HOSPITAL_COMMUNITY): Payer: Self-pay

## 2023-05-11 ENCOUNTER — Encounter: Payer: Self-pay | Admitting: Family Medicine

## 2023-05-11 DIAGNOSIS — I10 Essential (primary) hypertension: Secondary | ICD-10-CM

## 2023-05-11 DIAGNOSIS — Z0000 Encounter for general adult medical examination without abnormal findings: Secondary | ICD-10-CM

## 2023-05-12 ENCOUNTER — Other Ambulatory Visit (HOSPITAL_COMMUNITY): Payer: Self-pay

## 2023-05-12 MED ORDER — BENAZEPRIL HCL 10 MG PO TABS
10.0000 mg | ORAL_TABLET | Freq: Every day | ORAL | 11 refills | Status: DC
  Filled 2023-05-12: qty 30, 30d supply, fill #0
  Filled 2023-06-21: qty 30, 30d supply, fill #1
  Filled 2023-07-17: qty 30, 30d supply, fill #2
  Filled 2023-08-14: qty 30, 30d supply, fill #3
  Filled 2023-09-13: qty 30, 30d supply, fill #4
  Filled 2023-10-12: qty 30, 30d supply, fill #5
  Filled 2023-11-11: qty 30, 30d supply, fill #6
  Filled 2023-12-10: qty 30, 30d supply, fill #7
  Filled 2024-01-06: qty 30, 30d supply, fill #8
  Filled 2024-02-15: qty 30, 30d supply, fill #9
  Filled 2024-03-15: qty 30, 30d supply, fill #10
  Filled 2024-04-11: qty 30, 30d supply, fill #11

## 2023-05-12 NOTE — Telephone Encounter (Signed)
Rx sent for Benazepril and forwarded to PCP for refill on Viagra as last Rx was given by Dr Hassan Rowan.

## 2023-05-13 ENCOUNTER — Other Ambulatory Visit (HOSPITAL_COMMUNITY): Payer: Self-pay

## 2023-05-13 MED ORDER — SILDENAFIL CITRATE 25 MG PO TABS
ORAL_TABLET | ORAL | 2 refills | Status: DC
Start: 2023-05-13 — End: 2024-07-11
  Filled 2023-05-13: qty 6, 30d supply, fill #0
  Filled 2024-04-14: qty 6, 30d supply, fill #1

## 2023-05-13 NOTE — Telephone Encounter (Signed)
Script for viagra sent

## 2023-05-14 NOTE — Telephone Encounter (Signed)
Noted  

## 2023-05-16 ENCOUNTER — Encounter (HOSPITAL_COMMUNITY): Payer: Self-pay

## 2023-05-22 ENCOUNTER — Telehealth: Payer: 59 | Admitting: Nurse Practitioner

## 2023-05-22 ENCOUNTER — Other Ambulatory Visit (HOSPITAL_COMMUNITY): Payer: Self-pay

## 2023-05-22 DIAGNOSIS — U071 COVID-19: Secondary | ICD-10-CM

## 2023-05-22 MED ORDER — NIRMATRELVIR/RITONAVIR (PAXLOVID)TABLET
3.0000 | ORAL_TABLET | Freq: Two times a day (BID) | ORAL | 0 refills | Status: AC
Start: 2023-05-22 — End: 2023-05-27
  Filled 2023-05-22: qty 30, 5d supply, fill #0

## 2023-05-22 MED ORDER — FLUTICASONE PROPIONATE 50 MCG/ACT NA SUSP
2.0000 | Freq: Every day | NASAL | 6 refills | Status: DC
Start: 2023-05-22 — End: 2023-08-07
  Filled 2023-05-22: qty 16, 30d supply, fill #0

## 2023-05-22 NOTE — Progress Notes (Signed)
Virtual Visit Consent   Lawrence Wang, you are scheduled for a virtual visit with a [facility] provider today. Just as with appointments in the office, your consent must be obtained to participate. Your consent will be active for this visit and any virtual visit you may have with one of our providers in the next 365 days. If you have a MyChart account, a copy of this consent can be sent to you electronically.  As this is a virtual visit, video technology does not allow for your provider to perform a traditional examination. This may limit your provider's ability to fully assess your condition. If your provider identifies any concerns that need to be evaluated in person or the need to arrange testing (such as labs, EKG, etc.), we will make arrangements to do so. Although advances in technology are sophisticated, we cannot ensure that it will always work on either your end or our end. If the connection with a video visit is poor, the visit may have to be switched to a telephone visit. With either a video or telephone visit, we are not always able to ensure that we have a secure connection.  By engaging in this virtual visit, you consent to the provision of healthcare and authorize for your insurance to be billed (if applicable) for the services provided during this visit. Depending on your insurance coverage, you may receive a charge related to this service.  I need to obtain your verbal consent now. Are you willing to proceed with your visit today? Lawrence Wang has provided verbal consent on 05/22/2023 for a virtual visit (video or telephone). Viviano Simas, FNP  Date: 05/22/2023 8:07 AM  Virtual Visit via Video Note   I, Viviano Simas, connected with  Lawrence Wang  ([id number], [birth year]) on 05/22/23 at  8:15 AM EDT by a video-enabled telemedicine application and verified that I am speaking with the correct person using two identifiers.  Location: Patient: Virtual Visit Location Patient:  Home Provider: Virtual Visit Location Provider: Home Office   I discussed the limitations of evaluation and management by telemedicine and the availability of in person appointments. The patient expressed understanding and agreed to proceed.    History of Present Illness: Lawrence Wang is a 67 y.o. who identifies as a male who was assigned male at birth, and is being seen today  after testing positive for COVID.   He took a home COVID test yesterday and today Was positive today Today is day 3 of symptoms and overall feeling worse   Symptoms include: productive cough, sinus headache and sinus congestion.  He has had body aches, has not had a fever  He has had COVID twice in the past  Denies any complications from previous infections   He has been vaccinated without most recent booster   History includes HTN, no asthma or COPD  Denies a history of pneumonia or need for inhalers in the past   He had a physical last month for scuba diving and had an arrhythmia discovered during that physical   GFR 87 Denies a history of kidney disease    Problems:  Patient Active Problem List   Diagnosis Date Noted   Acute maxillary sinusitis 07/04/2022   Psoriasis 05/16/2022   Hypertension 04/19/2015   GERD (gastroesophageal reflux disease) 04/19/2015   Activities involving scuba diving 09/08/2014    Allergies:  Allergies  Allergen Reactions   Peanuts [Peanut Oil] Other (See Comments)    Headaches   Zithromax [  Azithromycin] Other (See Comments)    Hiccups-per patient   Medications:  Current Outpatient Medications:    Apremilast (OTEZLA) 30 MG TABS, Take 1 tablet by mouth twice a day, Disp: 60 tablet, Rfl: 11   benazepril (LOTENSIN) 10 MG tablet, Take 1 tablet (10 mg total) by mouth daily. Patient needs appointment., Disp: 30 tablet, Rfl: 11   calcipotriene (DOVONOX) 0.005 % ointment, Apply small amount to skin twice a day, Disp: 60 g, Rfl: 11   Flaxseed, Linseed, (FLAX SEEDS PO),  Take 1 capsule by mouth daily., Disp: , Rfl:    meloxicam (MOBIC) 7.5 MG tablet, Take 1 tablet (7.5 mg total) by mouth daily., Disp: 30 tablet, Rfl: 0   Multiple Vitamin (MULTIVITAMIN) capsule, Take 1 capsule by mouth daily., Disp: , Rfl:    omeprazole (PRILOSEC) 20 MG capsule, Take 1 capsule (20 mg total) by mouth every other day. (Patient taking differently: Take 20 mg by mouth daily.), Disp: , Rfl:    sildenafil (VIAGRA) 25 MG tablet, TAKE 1-3 TABLETS BY MOUTH AS NEEDED PRIOR TO SEXUAL INTERCOURSE. DO NOT USE MORE THEN 1 DOSE IN 24 HOURS, Disp: 10 tablet, Rfl: 2  Observations/Objective: Patient is well-developed, well-nourished in no acute distress.  Resting comfortably  at home.  Head is normocephalic, atraumatic.  No labored breathing.  Speech is clear and coherent with logical content.  Patient is alert and oriented at baseline.    Assessment and Plan:  1. COVID-19  - nirmatrelvir/ritonavir (PAXLOVID) 20 x 150 MG & 10 x 100MG  TABS; Take 3 tablets by mouth 2 (two) times daily for 5 days. (Take nirmatrelvir 150 mg two tablets twice daily for 5 days and ritonavir 100 mg one tablet twice daily for 5 days) Patient GFR is 87  Dispense: 30 tablet; Refill: 0 - fluticasone (FLONASE) 50 MCG/ACT nasal spray; Place 2 sprays into both nostrils daily.  Dispense: 16 g; Refill: 6    Take anti-viral with food   Recommend either Coricidin HBP or Mucinex without decongestant ingredients    Follow Up Instructions: I discussed the assessment and treatment plan with the patient. The patient was provided an opportunity to ask questions and all were answered. The patient agreed with the plan and demonstrated an understanding of the instructions.  A copy of instructions were sent to the patient via MyChart unless otherwise noted below.    The patient was advised to call back or seek an in-person evaluation if the symptoms worsen or if the condition fails to improve as anticipated.  Time:  I spent 14  minutes with the patient via telehealth technology discussing the above problems/concerns.    Viviano Simas, FNP

## 2023-06-10 ENCOUNTER — Other Ambulatory Visit (HOSPITAL_COMMUNITY): Payer: Self-pay | Admitting: Pharmacy Technician

## 2023-06-10 ENCOUNTER — Other Ambulatory Visit (HOSPITAL_COMMUNITY): Payer: Self-pay

## 2023-06-10 NOTE — Progress Notes (Signed)
Specialty Pharmacy Refill Coordination Note  Lawrence Wang is a 67 y.o. male contacted today regarding refills of specialty medication(s) Apremilast   Patient requested Lawrence Wang at [facility] at [place] date: 06/17/23   Medication will be filled on 06/16/23.

## 2023-06-16 ENCOUNTER — Other Ambulatory Visit (HOSPITAL_COMMUNITY): Payer: Self-pay

## 2023-06-16 ENCOUNTER — Other Ambulatory Visit: Payer: Self-pay

## 2023-06-16 NOTE — Progress Notes (Signed)
Pharmacy Patient Advocate Encounter   Received notification from Patient Pharmacy that prior authorization for Lawrence Wang is required/requested.   Insurance verification completed.   The patient is insured through [facility] .   Per test claim: PA required; PA submitted to [facility] via CoverMyMeds Key/confirmation #/EOC [id number] Status is pending

## 2023-06-16 NOTE — Progress Notes (Signed)
Pharmacy Patient Advocate Encounter  Received notification from [facility] that Prior Authorization for Lawrence Wang has been APPROVED from 06/16/23 to 06/15/24   PA #/Case ID/Reference #: [id number]

## 2023-06-18 ENCOUNTER — Other Ambulatory Visit (HOSPITAL_COMMUNITY): Payer: Self-pay

## 2023-06-22 ENCOUNTER — Other Ambulatory Visit (HOSPITAL_COMMUNITY): Payer: Self-pay

## 2023-07-06 ENCOUNTER — Other Ambulatory Visit: Payer: Self-pay

## 2023-07-07 ENCOUNTER — Other Ambulatory Visit (HOSPITAL_COMMUNITY): Payer: Self-pay

## 2023-07-08 ENCOUNTER — Other Ambulatory Visit: Payer: Self-pay

## 2023-07-09 ENCOUNTER — Other Ambulatory Visit (HOSPITAL_COMMUNITY): Payer: Self-pay

## 2023-07-09 ENCOUNTER — Encounter (HOSPITAL_COMMUNITY): Payer: Self-pay

## 2023-07-09 NOTE — Progress Notes (Signed)
Specialty Pharmacy Ongoing Clinical Assessment Note  Lawrence Wang is a 67 y.o. male who is being followed by the specialty pharmacy service for RxSp Psoriasis   Patient's specialty medication(s) reviewed today: Apremilast   Missed doses in the last 4 weeks: 2   Patient/Caregiver did not have any additional questions or concerns.   Therapeutic benefit summary: Patient is achieving benefit   Adverse events/side effects summary: No adverse events/side effects   Patient's therapy is appropriate to: Continue    Goals Addressed             This Visit's Progress    Minimize recurrence of flares       Patient is on track. Patient will maintain adherence.  [name] reports no recent flares and his psoriasis patches have stabilized but are not entire resolved.          Follow up:  6 months  Servando Snare Specialty Pharmacist

## 2023-07-09 NOTE — Progress Notes (Signed)
Specialty Pharmacy Refill Coordination Note  Lawrence Wang is a 67 y.o. male contacted today regarding refills of specialty medication(s) Apremilast   Patient requested Lawrence Wang at [facility] at [place] date: 07/21/23   Medication will be filled on 07/20/23.

## 2023-07-10 ENCOUNTER — Other Ambulatory Visit (HOSPITAL_COMMUNITY): Payer: Self-pay

## 2023-07-12 ENCOUNTER — Encounter: Payer: Self-pay | Admitting: Cardiovascular Disease

## 2023-07-12 NOTE — Progress Notes (Unsigned)
  Cardiology Office Note:  .   Date:  07/14/2023  ID:  Jacinto, Leva 05-29-1956, MRN [id number] PCP: Karie Georges, MD  [facility] Providers Cardiologist:  None    History of Present Illness: .   Lawrence Wang is a 67 y.o. 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  Gets some exercise   Walks on occasion , outside and treadmill    Father had CABG in his 64s        ROS:   Studies Reviewed: Marland Kitchen    EKG Interpretation Date/Time:  Tuesday July 14 2023 08:55:36 EST Ventricular Rate:  76 PR Interval:  138 QRS Duration:  70 QT Interval:  344 QTC Calculation: 387 R Axis:   71  Text Interpretation: Normal sinus rhythm Nonspecific ST abnormality When compared with ECG of 18-Sep-2021 10:19, T wave inversion no longer evident in Inferior leads Confirmed by Kristeen Miss ([id number]) on 07/14/2023 9:23:38 AM    Risk Assessment/Calculations:             Physical Exam:   VS:  BP 124/80   Pulse 76   Ht 6\' 3"  (1.905 m)   Wt 222 lb 9.6 oz (101 kg)   SpO2 95%   BMI 27.82 kg/m    Wt Readings from Last 3 Encounters:  07/14/23 222 lb 9.6 oz (101 kg)  04/29/23 220 lb (99.8 kg)  04/20/23 225 lb 1.6 oz (102.1 kg)    GEN: Well nourished, well developed in no acute distress NECK: No JVD; No carotid bruits CARDIAC: RRR, no murmurs, rubs, gallops RESPIRATORY:  Clear to auscultation without rales, wheezing or rhonchi  ABDOMEN: Soft, non-tender, non-distended EXTREMITIES:  No edema; No deformity   ASSESSMENT AND PLAN: .   1.  Abnormal EKG: Petra is a very healthy middle-aged gentleman.  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  On his last physical he was found to have an extra heartbeat.  I am assuming this was a premature atrial contraction or  premature ventricular contraction.  Reviewing his EKG from last year he also had some nonspecific T wave inversions.  His EKG today is essentially normal.  I would like to get an echocardiogram for further evaluation of his cardiac status and valvular status.  2.  Family history of coronary artery disease: His father had history of bypass grafting in his 28s.  Will start with a coronary calcium score.  I will see him in 3 months.        Dispo: 3 months   Signed, Kristeen Miss, MD

## 2023-07-14 ENCOUNTER — Encounter: Payer: Self-pay | Admitting: Cardiovascular Disease

## 2023-07-14 ENCOUNTER — Ambulatory Visit: Payer: 59 | Attending: Cardiovascular Disease | Admitting: Cardiovascular Disease

## 2023-07-14 VITALS — BP 124/80 | HR 76 | Ht 75.0 in | Wt 222.6 lb

## 2023-07-14 DIAGNOSIS — I10 Essential (primary) hypertension: Secondary | ICD-10-CM

## 2023-07-14 DIAGNOSIS — Z8249 Family history of ischemic heart disease and other diseases of the circulatory system: Secondary | ICD-10-CM | POA: Diagnosis not present

## 2023-07-14 DIAGNOSIS — Z09 Encounter for follow-up examination after completed treatment for conditions other than malignant neoplasm: Secondary | ICD-10-CM | POA: Diagnosis not present

## 2023-07-14 DIAGNOSIS — I493 Ventricular premature depolarization: Secondary | ICD-10-CM | POA: Diagnosis not present

## 2023-07-14 NOTE — Patient Instructions (Signed)
Testing/Procedures: ECHO Your physician has requested that you have an echocardiogram. Echocardiography is a painless test that uses sound waves to create images of your heart. It provides your doctor with information about the size and shape of your heart and how well your heart's chambers and valves are working. This procedure takes approximately one hour. There are no restrictions for this procedure. Please do NOT wear cologne, perfume, aftershave, or lotions (deodorant is allowed). Please arrive 15 minutes prior to your appointment time.  Please note: We ask at that you not bring children with you during ultrasound (echo/ vascular) testing. Due to room size and safety concerns, children are not allowed in the ultrasound rooms during exams. Our front office staff cannot provide observation of children in our lobby area while testing is being conducted. An adult accompanying a patient to their appointment will only be allowed in the ultrasound room at the discretion of the ultrasound technician under special circumstances. We apologize for any inconvenience.  Coronary Calcium Score CT Non-Cardiac CT scanning, (CAT scanning), is a noninvasive, special x-ray that produces cross-sectional images of the body using x-rays and a computer. CT scans help physicians diagnose and treat medical conditions. For some CT exams, a contrast material is used to enhance visibility in the area of the body being studied. CT scans provide greater clarity and reveal more details than regular x-ray exams.  Follow-Up: At [facility], you and your health needs are our priority.  As part of our continuing mission to provide you with exceptional heart care, we have created designated Provider Care Teams.  These Care Teams include your primary Cardiologist (physician) and Advanced Practice Providers (APPs -  Physician Assistants and Nurse Practitioners) who all work together to provide you with the care you need, when  you need it.  Your next appointment:   3 month(s)  Provider:   Kristeen Miss, MD

## 2023-07-21 DIAGNOSIS — H903 Sensorineural hearing loss, bilateral: Secondary | ICD-10-CM | POA: Diagnosis not present

## 2023-07-28 ENCOUNTER — Ambulatory Visit (HOSPITAL_COMMUNITY)
Admission: RE | Admit: 2023-07-28 | Discharge: 2023-07-28 | Disposition: A | Discharge status: Home / Self Care | Payer: 59 | Source: Ambulatory Visit | Attending: Cardiovascular Disease | Admitting: Cardiovascular Disease

## 2023-07-28 DIAGNOSIS — Z8249 Family history of ischemic heart disease and other diseases of the circulatory system: Secondary | ICD-10-CM | POA: Insufficient documentation

## 2023-07-28 DIAGNOSIS — I493 Ventricular premature depolarization: Secondary | ICD-10-CM | POA: Insufficient documentation

## 2023-07-28 DIAGNOSIS — Z09 Encounter for follow-up examination after completed treatment for conditions other than malignant neoplasm: Secondary | ICD-10-CM | POA: Insufficient documentation

## 2023-07-28 DIAGNOSIS — I10 Essential (primary) hypertension: Secondary | ICD-10-CM | POA: Insufficient documentation

## 2023-07-30 ENCOUNTER — Other Ambulatory Visit (HOSPITAL_COMMUNITY): Payer: Self-pay

## 2023-07-30 ENCOUNTER — Telehealth: Payer: Self-pay

## 2023-07-30 DIAGNOSIS — Z79899 Other long term (current) drug therapy: Secondary | ICD-10-CM

## 2023-07-30 DIAGNOSIS — E782 Mixed hyperlipidemia: Secondary | ICD-10-CM

## 2023-07-30 MED ORDER — ATORVASTATIN CALCIUM 40 MG PO TABS
40.0000 mg | ORAL_TABLET | Freq: Every day | ORAL | 3 refills | Status: DC
  Filled 2023-07-30: qty 90, 90d supply, fill #0
  Filled 2023-10-25: qty 90, 90d supply, fill #1
  Filled 2024-01-19: qty 90, 90d supply, fill #2
  Filled 2024-04-22: qty 90, 90d supply, fill #3

## 2023-07-30 NOTE — Telephone Encounter (Signed)
Called and spoke with patient who agrees to plan. Medication sent to pharmacy on file. Repeat labs entered and released for pt to get March 2025.

## 2023-07-30 NOTE — Telephone Encounter (Signed)
-----   Message from Kristeen Miss sent at 07/30/2023  8:35 AM EST ----- CAC score is 270 ( 55th percentile for age / sex matched controls) He is at mildly increased risk for CAD for his age / sex His goal LDL is <70 His last LDL is 119  I would suggest he start Atorvastatin 40 mg a day Check lipids / ALT / BMP in 3 months   Echo is scheduled in several weeks

## 2023-08-07 ENCOUNTER — Encounter: Payer: Self-pay | Admitting: Family Medicine

## 2023-08-07 ENCOUNTER — Ambulatory Visit (INDEPENDENT_AMBULATORY_CARE_PROVIDER_SITE_OTHER): Payer: 59 | Admitting: Family Medicine

## 2023-08-07 ENCOUNTER — Ambulatory Visit (INDEPENDENT_AMBULATORY_CARE_PROVIDER_SITE_OTHER)
Admission: RE | Admit: 2023-08-07 | Discharge: 2023-08-07 | Disposition: A | Discharge status: Home / Self Care | Payer: 59 | Source: Ambulatory Visit | Attending: Family Medicine | Admitting: Family Medicine

## 2023-08-07 VITALS — BP 100/50 | HR 95 | Temp 98.6°F | Ht 75.0 in | Wt 224.2 lb

## 2023-08-07 DIAGNOSIS — M25561 Pain in right knee: Secondary | ICD-10-CM

## 2023-08-07 DIAGNOSIS — I10 Essential (primary) hypertension: Secondary | ICD-10-CM | POA: Diagnosis not present

## 2023-08-07 DIAGNOSIS — Z23 Encounter for immunization: Secondary | ICD-10-CM

## 2023-08-07 DIAGNOSIS — E785 Hyperlipidemia, unspecified: Secondary | ICD-10-CM | POA: Diagnosis not present

## 2023-08-07 DIAGNOSIS — G8929 Other chronic pain: Secondary | ICD-10-CM | POA: Diagnosis not present

## 2023-08-07 DIAGNOSIS — M1120 Other chondrocalcinosis, unspecified site: Secondary | ICD-10-CM

## 2023-08-07 NOTE — Assessment & Plan Note (Signed)
Persistent since August, I do not think it is intraarticular, most likely bursitis, will check X-rays of the knee and he will likely need referral to sports medicine to discuss getting an injection

## 2023-08-07 NOTE — Assessment & Plan Note (Signed)
On lipitor 40 mg daily, needs new lipid panel done.

## 2023-08-07 NOTE — Assessment & Plan Note (Signed)
Current hypertension medications:       Sig   benazepril (LOTENSIN) 10 MG tablet (Taking) Take 1 tablet (10 mg total) by mouth daily. Patient needs appointment.   sildenafil (VIAGRA) 25 MG tablet (Taking) TAKE 1-3 TABLETS BY MOUTH AS NEEDED PRIOR TO SEXUAL INTERCOURSE. DO NOT USE MORE THEN 1 DOSE IN 24 HOURS      BP is well controlled today on the benazepril, pt is due for his annual CMP and lipid panel today. Will place orders and pt will need to return for his annual physical exam.

## 2023-08-07 NOTE — Progress Notes (Signed)
Established Patient Office Visit  Subjective   Patient ID: Lawrence Wang, male    DOB: 04/23/1956  Age: 67 y.o. MRN: [id number]  Chief Complaint  Patient presents with   Knee Pain    Patient complains of recurrent right knee pain since last visit with Dr Ardyth Harps in August, wearing brace with relief, worse at night, tried heat and ice with no relief, questioned if related to psoriasis   Shoulder Pain    Patient complains of right shoulder pain for years    Patient is here to follow up on the knee pain he was experiencing, states he took the meloxicam and OTC medications occasionally, he will have good days and bad days however it hasn't resolved. Still wearing the knee brace which is helping some. States that he hasn't been able to exercise regularly since August due to the knee problems. Is having pain on the medial side of the knee. States that it is not tender to palpation.   States that his right shoulder has also been bothering him, states that it is also intermittent, states he has some pain when he raises his elbow above the level of his shoulder.  Hurting anteriorly, states hat he had an x-ray at some point. States that generally he is starting to feel more joint pain in other joints, can be random aches and pain at times. Is on Otezla daily for his psoriasis and is concerned about psoriatic arthritis.   HTN -- BP in office performed and is well controlled. He  reports no side effects to the medications, no chest pain, SOB, dizziness or headaches. He has a BP cuff at home and is checking BP regularly, reports they are in the normal range.        Current Outpatient Medications  Medication Instructions   Apremilast (OTEZLA) 30 MG TABS Take 1 tablet by mouth twice a day   atorvastatin (LIPITOR) 40 mg, Oral, Daily   benazepril (LOTENSIN) 10 mg, Oral, Daily, Patient needs appointment.   calcipotriene (DOVONOX) 0.005 % ointment Apply small amount to skin twice a day   Flaxseed,  Linseed, (FLAX SEEDS PO) 1 capsule, Daily   Multiple Vitamin (MULTIVITAMIN) capsule 1 capsule, Daily   omeprazole (PRILOSEC) 20 mg, Oral, Every other day   sildenafil (VIAGRA) 25 MG tablet TAKE 1-3 TABLETS BY MOUTH AS NEEDED PRIOR TO SEXUAL INTERCOURSE. DO NOT USE MORE THEN 1 DOSE IN 24 HOURS    Patient Active Problem List   Diagnosis Date Noted   Acute pain of right knee 08/07/2023   Hyperlipidemia 08/07/2023   Acute maxillary sinusitis 07/04/2022   Psoriasis 05/16/2022   Hypertension 04/19/2015   GERD (gastroesophageal reflux disease) 04/19/2015   Activities involving scuba diving 09/08/2014      Review of Systems  All other systems reviewed and are negative.     Objective:     BP (!) 100/50   Pulse 95   Temp 98.6 F (37 C) (Oral)   Ht 6\' 3"  (1.905 m)   Wt 224 lb 3.2 oz (101.7 kg)   SpO2 98%   BMI 28.02 kg/m    Physical Exam Vitals reviewed.  Constitutional:      Appearance: Normal appearance. He is well-groomed and normal weight.  Cardiovascular:     Rate and Rhythm: Normal rate and regular rhythm.     Heart sounds: S1 normal and S2 normal. No murmur heard. Pulmonary:     Effort: Pulmonary effort is normal.     Breath  sounds: Normal breath sounds and air entry. No rales.  Musculoskeletal:     Right knee: Normal. No swelling or effusion. No tenderness.     Right lower leg: No edema.     Left lower leg: No edema.  Neurological:     General: No focal deficit present.     Mental Status: He is alert and oriented to person, place, and time.     Gait: Gait is intact.  Psychiatric:        Mood and Affect: Mood and affect normal.      No results found for any visits on 08/07/23.    The 10-year ASCVD risk score (Arnett DK, et al., 2019) is: 10.5%    Assessment & Plan:  Acute pain of right knee Assessment & Plan: Persistent since August, I do not think it is intraarticular, most likely bursitis, will check X-rays of the knee and he will likely need  referral to sports medicine to discuss getting an injection  Orders: -     DG Knee 1-2 Views Right; Future  Primary hypertension Assessment & Plan: Current hypertension medications:       Sig   benazepril (LOTENSIN) 10 MG tablet (Taking) Take 1 tablet (10 mg total) by mouth daily. Patient needs appointment.   sildenafil (VIAGRA) 25 MG tablet (Taking) TAKE 1-3 TABLETS BY MOUTH AS NEEDED PRIOR TO SEXUAL INTERCOURSE. DO NOT USE MORE THEN 1 DOSE IN 24 HOURS      BP is well controlled today on the benazepril, pt is due for his annual CMP and lipid panel today. Will place orders and pt will need to return for his annual physical exam.   Orders: -     Comprehensive metabolic panel; Future  Hyperlipidemia, unspecified hyperlipidemia type Assessment & Plan: On lipitor 40 mg daily, needs new lipid panel done.   Orders: -     Lipid panel; Future  Immunization due -     Pneumococcal conjugate vaccine 20-valent     Return for annual physical exam -- py may schedule at pt's convenience.    Karie Georges, MD

## 2023-08-10 ENCOUNTER — Other Ambulatory Visit: Payer: Self-pay

## 2023-08-10 NOTE — Progress Notes (Signed)
Specialty Pharmacy Refill Coordination Note  Lawrence Wang is a 67 y.o. male contacted today regarding refills of specialty medication(s) Apremilast [name]   Patient requested Lawrence Wang at [facility] at [place] date: 08/11/23   Medication will be filled on 12.17.24 when insurance will pay.

## 2023-08-11 ENCOUNTER — Other Ambulatory Visit (HOSPITAL_COMMUNITY): Payer: Self-pay

## 2023-08-11 ENCOUNTER — Other Ambulatory Visit: Payer: Self-pay

## 2023-08-13 ENCOUNTER — Ambulatory Visit (HOSPITAL_COMMUNITY): Payer: 59 | Attending: Cardiology

## 2023-08-13 DIAGNOSIS — I10 Essential (primary) hypertension: Secondary | ICD-10-CM | POA: Insufficient documentation

## 2023-08-13 DIAGNOSIS — I493 Ventricular premature depolarization: Secondary | ICD-10-CM | POA: Diagnosis not present

## 2023-08-13 DIAGNOSIS — Z8249 Family history of ischemic heart disease and other diseases of the circulatory system: Secondary | ICD-10-CM | POA: Insufficient documentation

## 2023-08-13 DIAGNOSIS — Z09 Encounter for follow-up examination after completed treatment for conditions other than malignant neoplasm: Secondary | ICD-10-CM | POA: Insufficient documentation

## 2023-08-13 LAB — ECHOCARDIOGRAM COMPLETE
Area-P 1/2: 3.67 cm2
S' Lateral: 2.6 cm

## 2023-08-14 ENCOUNTER — Other Ambulatory Visit (HOSPITAL_COMMUNITY): Payer: Self-pay

## 2023-08-17 ENCOUNTER — Other Ambulatory Visit (INDEPENDENT_AMBULATORY_CARE_PROVIDER_SITE_OTHER): Payer: 59

## 2023-08-17 DIAGNOSIS — E785 Hyperlipidemia, unspecified: Secondary | ICD-10-CM

## 2023-08-17 DIAGNOSIS — I10 Essential (primary) hypertension: Secondary | ICD-10-CM

## 2023-08-17 LAB — COMPREHENSIVE METABOLIC PANEL
ALT: 17 U/L (ref 0–53)
AST: 17 U/L (ref 0–37)
Albumin: 4.2 g/dL (ref 3.5–5.2)
Alkaline Phosphatase: 15 U/L — ABNORMAL LOW (ref 39–117)
BUN: 13 mg/dL (ref 6–23)
CO2: 30 meq/L (ref 19–32)
Calcium: 9.3 mg/dL (ref 8.4–10.5)
Chloride: 104 meq/L (ref 96–112)
Creatinine, Ser: 0.83 mg/dL (ref 0.40–1.50)
GFR: 91.73 mL/min (ref >60.00)
Glucose, Bld: 100 mg/dL — ABNORMAL HIGH (ref 70–99)
Potassium: 4.3 meq/L (ref 3.5–5.1)
Sodium: 140 meq/L (ref 135–145)
Total Bilirubin: 0.8 mg/dL (ref 0.2–1.2)
Total Protein: 6.7 g/dL (ref 6.0–8.3)

## 2023-08-17 LAB — LIPID PANEL
Cholesterol: 129 mg/dL (ref 0–200)
HDL: 36.5 mg/dL — ABNORMAL LOW (ref >39.00)
LDL Cholesterol: 77 mg/dL (ref 0–99)
NonHDL: 92.74
Total CHOL/HDL Ratio: 4
Triglycerides: 77 mg/dL (ref 0.0–149.0)
VLDL: 15.4 mg/dL (ref 0.0–40.0)

## 2023-08-18 ENCOUNTER — Telehealth: Payer: Self-pay

## 2023-08-18 DIAGNOSIS — I77819 Aortic ectasia, unspecified site: Secondary | ICD-10-CM

## 2023-08-18 NOTE — Telephone Encounter (Signed)
Called and spoke with patient who verbalized understanding and agrees to CTA of aorta. Order placed at this time.

## 2023-08-18 NOTE — Telephone Encounter (Signed)
-----   Message from Kristeen Miss sent at 08/16/2023  7:59 AM EST ----- Normal LV systolic function with LVEF of 60-65%.   Grade I DD RV function is normal  Mild LAE (insignificant )  Trivial MR  Mild calcification of the AV Mild dilatation of the ascending aorta  measuring 44 mm  Trivial TR  His LV function is normal .  His occasional arrhythmias are benign  He will need follow up for his mild dilatation of his aorta but this will not prevent him from being able to safely do his SCUBA diving at the [facility]  Please order a CTA of his entire aorta . Follow up as scheduled    He is clear to [facility]

## 2023-08-20 ENCOUNTER — Encounter: Payer: Self-pay | Admitting: Family Medicine

## 2023-08-20 ENCOUNTER — Encounter (HOSPITAL_BASED_OUTPATIENT_CLINIC_OR_DEPARTMENT_OTHER): Payer: Self-pay

## 2023-08-20 NOTE — Addendum Note (Signed)
Addended by: Karie Georges on: 08/20/2023 11:03 AM   Modules accepted: Orders

## 2023-08-28 ENCOUNTER — Encounter: Payer: Self-pay | Admitting: Family Medicine

## 2023-08-31 ENCOUNTER — Ambulatory Visit (HOSPITAL_COMMUNITY): Payer: 59

## 2023-09-03 ENCOUNTER — Ambulatory Visit (INDEPENDENT_AMBULATORY_CARE_PROVIDER_SITE_OTHER): Payer: Commercial Managed Care - PPO | Admitting: Orthopaedic Surgery

## 2023-09-03 DIAGNOSIS — M25561 Pain in right knee: Secondary | ICD-10-CM

## 2023-09-03 DIAGNOSIS — G8929 Other chronic pain: Secondary | ICD-10-CM

## 2023-09-03 MED ORDER — LIDOCAINE HCL 1 % IJ SOLN
2.0000 mL | INTRAMUSCULAR | Status: AC | PRN
  Administered 2023-09-03: 2 mL

## 2023-09-03 MED ORDER — METHYLPREDNISOLONE ACETATE 40 MG/ML IJ SUSP
40.0000 mg | INTRAMUSCULAR | Status: AC | PRN
  Administered 2023-09-03: 40 mg via INTRA_ARTICULAR

## 2023-09-03 MED ORDER — BUPIVACAINE HCL 0.5 % IJ SOLN
2.0000 mL | INTRAMUSCULAR | Status: AC | PRN
  Administered 2023-09-03: 2 mL via INTRA_ARTICULAR

## 2023-09-03 NOTE — Progress Notes (Signed)
 Office Visit Note   Patient: Lawrence Wang           Date of Birth: [birth year]           MRN: [id number] Visit Date: 09/03/2023              Requested by: Lawrence Heron HERO, MD [address] [place],  [place] [address] PCP: Lawrence Heron HERO, MD   Assessment & Plan: Visit Diagnoses:  1. Chronic pain of right knee     Plan: Lawrence Wang is a 68 year old gentleman with chronic right knee pain.  Could be chondrocalcinosis or degenerative medial meniscus tear.  No effusion on exam.  Treatment options reviewed and we proceeded with a cortisone injection and I provided home exercise program.  Follow-up if symptoms persist beyond 6 weeks.  Follow-Up Instructions: No follow-ups on file.   Orders:  Orders Placed This Encounter  Procedures   Large Joint Inj   No orders of the defined types were placed in this encounter.     Procedures: Large Joint Inj: R knee on 09/03/2023 5:13 PM Indications: pain Details: 22 G needle  Arthrogram: No  Medications: 40 mg methylPREDNISolone  acetate 40 MG/ML; 2 mL lidocaine  1 %; 2 mL bupivacaine  0.5 % Consent was given by the patient. Patient was prepped and draped in the usual sterile fashion.       Clinical Data: No additional findings.   Subjective: Chief Complaint  Patient presents with   Right Knee - Pain    HPI Lawrence Wang is a very pleasant 68 year old gentleman comes in for evaluation of right knee pain for 6 months.  Denies any injuries.  Denies any burning tingling or numbness.  Occasional night pain that prevents him from sleeping.  Denies any mechanical symptoms.  Naproxen helps some. Review of Systems  Constitutional: Negative.   HENT: Negative.    Eyes: Negative.   Respiratory: Negative.    Cardiovascular: Negative.   Gastrointestinal: Negative.   Endocrine: Negative.   Genitourinary: Negative.   Skin: Negative.   Allergic/Immunologic: Negative.   Neurological: Negative.   Hematological: Negative.    Psychiatric/Behavioral: Negative.    All other systems reviewed and are negative.    Objective: Vital Signs: There were no vitals taken for this visit.  Physical Exam Vitals and nursing note reviewed.  Constitutional:      Appearance: He is well-developed.  HENT:     Head: Normocephalic and atraumatic.  Eyes:     Pupils: Pupils are equal, round, and reactive to light.  Pulmonary:     Effort: Pulmonary effort is normal.  Abdominal:     Palpations: Abdomen is soft.  Musculoskeletal:        General: Normal range of motion.     Cervical back: Neck supple.  Skin:    General: Skin is warm.  Neurological:     Mental Status: He is alert and oriented to person, place, and time.  Psychiatric:        Behavior: Behavior normal.        Thought Content: Thought content normal.        Judgment: Judgment normal.     Ortho Exam Examination of the right knee shows no joint effusion or joint line tenderness.  Normal range of motion.  Collaterals and cruciates are stable. Specialty Comments:  No specialty comments available.  Imaging: No results found.   PMFS History: Patient Active Problem List   Diagnosis Date Noted   Acute pain of right knee 08/07/2023  Hyperlipidemia 08/07/2023   Acute maxillary sinusitis 07/04/2022   Psoriasis 05/16/2022   Hypertension 04/19/2015   GERD (gastroesophageal reflux disease) 04/19/2015   Activities involving scuba diving 09/08/2014   Past Medical History:  Diagnosis Date   GERD (gastroesophageal reflux disease)    Hypertension    Psoriasis    diagnosed by [facility] per patient    Family History  Problem Relation Age of Onset   Cervical cancer Mother 36   High blood pressure Mother    Thyroid disease Mother    Heart block Father    CAD Father 37       +smoker   Lung cancer Brother    Heart attack Maternal Grandfather        age unknown; 8's?   Throat cancer Paternal Grandmother    Colon polyps Neg Hx    Colon cancer Neg  Hx    Esophageal cancer Neg Hx    Rectal cancer Neg Hx    Stomach cancer Neg Hx     Past Surgical History:  Procedure Laterality Date   APPENDECTOMY     INGUINAL HERNIA REPAIR Bilateral 09/23/2021   Procedure: LAPAROSCOPIC BILATERAL INGUINAL HERNIA REPAIR WITH MESH;  Surgeon: Rubin Calamity, MD;  Location: [facility] OR;  Service: General;  Laterality: Bilateral;   INSERTION OF MESH Bilateral 09/23/2021   Procedure: INSERTION OF MESH;  Surgeon: Rubin Calamity, MD;  Location: [facility] OR;  Service: General;  Laterality: Bilateral;   NASAL SEPTUM SURGERY     TONSILLECTOMY     UVULECTOMY     Social History   Occupational History   Not on file  Tobacco Use   Smoking status: Never   Smokeless tobacco: Never  Vaping Use   Vaping status: Never Used  Substance and Sexual Activity   Alcohol use: Yes    Alcohol/week: 0.0 standard drinks of alcohol    Comment: glass of wine 4 nights per week   Drug use: No   Sexual activity: Not on file

## 2023-09-08 ENCOUNTER — Ambulatory Visit (HOSPITAL_COMMUNITY)
Admission: RE | Admit: 2023-09-08 | Discharge: 2023-09-08 | Disposition: A | Discharge status: Home / Self Care | Payer: Commercial Managed Care - PPO | Source: Ambulatory Visit | Attending: Cardiovascular Disease | Admitting: Cardiovascular Disease

## 2023-09-08 ENCOUNTER — Other Ambulatory Visit: Payer: Self-pay

## 2023-09-08 DIAGNOSIS — I2510 Atherosclerotic heart disease of native coronary artery without angina pectoris: Secondary | ICD-10-CM | POA: Diagnosis not present

## 2023-09-08 DIAGNOSIS — I7121 Aneurysm of the ascending aorta, without rupture: Secondary | ICD-10-CM | POA: Diagnosis not present

## 2023-09-08 DIAGNOSIS — I77819 Aortic ectasia, unspecified site: Secondary | ICD-10-CM | POA: Diagnosis not present

## 2023-09-08 DIAGNOSIS — I700 Atherosclerosis of aorta: Secondary | ICD-10-CM | POA: Diagnosis not present

## 2023-09-08 MED ORDER — IOHEXOL 350 MG/ML SOLN
75.0000 mL | Freq: Once | INTRAVENOUS | Status: AC | PRN
  Administered 2023-09-08: 75 mL via INTRAVENOUS

## 2023-09-08 NOTE — Progress Notes (Signed)
 Specialty Pharmacy Refill Coordination Note  Lawrence Wang is a 68 y.o. male contacted today regarding refills of specialty medication(s) Apremilast  (Otezla )   Patient requested [name] at [facility] at [place] date: 09/14/23   Medication will be filled on 09/11/23.

## 2023-09-11 ENCOUNTER — Other Ambulatory Visit: Payer: Self-pay

## 2023-09-11 ENCOUNTER — Other Ambulatory Visit (HOSPITAL_COMMUNITY): Payer: Self-pay

## 2023-09-13 ENCOUNTER — Encounter: Payer: Self-pay | Admitting: Cardiovascular Disease

## 2023-09-13 DIAGNOSIS — I77819 Aortic ectasia, unspecified site: Secondary | ICD-10-CM

## 2023-09-16 DIAGNOSIS — D2361 Other benign neoplasm of skin of right upper limb, including shoulder: Secondary | ICD-10-CM | POA: Diagnosis not present

## 2023-09-16 DIAGNOSIS — D2262 Melanocytic nevi of left upper limb, including shoulder: Secondary | ICD-10-CM | POA: Diagnosis not present

## 2023-09-16 DIAGNOSIS — D225 Melanocytic nevi of trunk: Secondary | ICD-10-CM | POA: Diagnosis not present

## 2023-09-16 DIAGNOSIS — D1801 Hemangioma of skin and subcutaneous tissue: Secondary | ICD-10-CM | POA: Diagnosis not present

## 2023-09-16 DIAGNOSIS — D224 Melanocytic nevi of scalp and neck: Secondary | ICD-10-CM | POA: Diagnosis not present

## 2023-09-16 DIAGNOSIS — L400 Psoriasis vulgaris: Secondary | ICD-10-CM | POA: Diagnosis not present

## 2023-09-16 DIAGNOSIS — D2261 Melanocytic nevi of right upper limb, including shoulder: Secondary | ICD-10-CM | POA: Diagnosis not present

## 2023-09-16 DIAGNOSIS — L821 Other seborrheic keratosis: Secondary | ICD-10-CM | POA: Diagnosis not present

## 2023-09-16 DIAGNOSIS — L570 Actinic keratosis: Secondary | ICD-10-CM | POA: Diagnosis not present

## 2023-09-16 DIAGNOSIS — D2271 Melanocytic nevi of right lower limb, including hip: Secondary | ICD-10-CM | POA: Diagnosis not present

## 2023-09-16 NOTE — Telephone Encounter (Addendum)
Repeat CTA order placed at this time. Patient has viewed MD's comments/recommendations via MyChart.  ----- Message from Kristeen Miss sent at 09/13/2023  8:31 AM EST ----- Mild aneurysmal dilation of the ascending aorta measuring 4.3 cm. This is mildly dilated based on his height and BSA   Estimated body surface area is 2.32 meters squared as calculated from the following:   Height as of 08/07/23: 6\' 3"  (1.905 m).   Weight as of 08/07/23: 224 lb 3.2 oz (101.7 kg).    Repeat CTA of thoracic aorta in 1 year

## 2023-09-23 DIAGNOSIS — H2513 Age-related nuclear cataract, bilateral: Secondary | ICD-10-CM | POA: Diagnosis not present

## 2023-09-23 DIAGNOSIS — H40013 Open angle with borderline findings, low risk, bilateral: Secondary | ICD-10-CM | POA: Diagnosis not present

## 2023-09-23 DIAGNOSIS — H43813 Vitreous degeneration, bilateral: Secondary | ICD-10-CM | POA: Diagnosis not present

## 2023-09-23 DIAGNOSIS — H532 Diplopia: Secondary | ICD-10-CM | POA: Diagnosis not present

## 2023-09-28 ENCOUNTER — Encounter: Payer: Commercial Managed Care - PPO | Admitting: Family Medicine

## 2023-10-02 ENCOUNTER — Other Ambulatory Visit (HOSPITAL_COMMUNITY): Payer: Self-pay

## 2023-10-06 ENCOUNTER — Other Ambulatory Visit: Payer: Self-pay

## 2023-10-16 ENCOUNTER — Other Ambulatory Visit: Payer: Self-pay

## 2023-10-16 ENCOUNTER — Other Ambulatory Visit: Payer: Self-pay | Admitting: Pharmacy Technician

## 2023-10-16 NOTE — Progress Notes (Signed)
 Specialty Pharmacy Refill Coordination Note  Lawrence Wang is a 68 y.o. male contacted today regarding refills of specialty medication(s) Apremilast [name]   Patient requested Lawrence Wang at [facility] at [place] date: 10/28/23   Medication will be filled on 10/27/23.

## 2023-10-19 ENCOUNTER — Ambulatory Visit (INDEPENDENT_AMBULATORY_CARE_PROVIDER_SITE_OTHER): Payer: Commercial Managed Care - PPO | Admitting: Family Medicine

## 2023-10-19 ENCOUNTER — Encounter: Payer: Self-pay | Admitting: Family Medicine

## 2023-10-19 VITALS — BP 120/60 | HR 71 | Temp 98.3°F | Ht 75.0 in | Wt 223.2 lb

## 2023-10-19 DIAGNOSIS — Z0000 Encounter for general adult medical examination without abnormal findings: Secondary | ICD-10-CM | POA: Diagnosis not present

## 2023-10-19 NOTE — Patient Instructions (Signed)
 Health Maintenance, Male  Adopting a healthy lifestyle and getting preventive care are important in promoting health and wellness. Ask your health care provider about:  The right schedule for you to have regular tests and exams.  Things you can do on your own to prevent diseases and keep yourself healthy.  What should I know about diet, weight, and exercise?  Eat a healthy diet    Eat a diet that includes plenty of vegetables, fruits, low-fat dairy products, and lean protein.  Do not eat a lot of foods that are high in solid fats, added sugars, or sodium.  Maintain a healthy weight  Body mass index (BMI) is a measurement that can be used to identify possible weight problems. It estimates body fat based on height and weight. Your health care provider can help determine your BMI and help you achieve or maintain a healthy weight.  Get regular exercise  Get regular exercise. This is one of the most important things you can do for your health. Most adults should:  Exercise for at least 150 minutes each week. The exercise should increase your heart rate and make you sweat (moderate-intensity exercise).  Do strengthening exercises at least twice a week. This is in addition to the moderate-intensity exercise.  Spend less time sitting. Even light physical activity can be beneficial.  Watch cholesterol and blood lipids  Have your blood tested for lipids and cholesterol at 66 years of age, then have this test every 5 years.  You may need to have your cholesterol levels checked more often if:  Your lipid or cholesterol levels are high.  You are older than 66 years of age.  You are at high risk for heart disease.  What should I know about cancer screening?  Many types of cancers can be detected early and may often be prevented. Depending on your health history and family history, you may need to have cancer screening at various ages. This may include screening for:  Colorectal cancer.  Prostate cancer.  Skin cancer.  Lung  cancer.  What should I know about heart disease, diabetes, and high blood pressure?  Blood pressure and heart disease  High blood pressure causes heart disease and increases the risk of stroke. This is more likely to develop in people who have high blood pressure readings or are overweight.  Talk with your health care provider about your target blood pressure readings.  Have your blood pressure checked:  Every 3-5 years if you are 9-95 years of age.  Every year if you are 85 years old or older.  If you are between the ages of 29 and 29 and are a current or former smoker, ask your health care provider if you should have a one-time screening for abdominal aortic aneurysm (AAA).  Diabetes  Have regular diabetes screenings. This checks your fasting blood sugar level. Have the screening done:  Once every three years after age 23 if you are at a normal weight and have a low risk for diabetes.  More often and at a younger age if you are overweight or have a high risk for diabetes.  What should I know about preventing infection?  Hepatitis B  If you have a higher risk for hepatitis B, you should be screened for this virus. Talk with your health care provider to find out if you are at risk for hepatitis B infection.  Hepatitis C  Blood testing is recommended for:  Everyone born from [birth year] through 1963.  Anyone  with known risk factors for hepatitis C.  Sexually transmitted infections (STIs)  You should be screened each year for STIs, including gonorrhea and chlamydia, if:  You are sexually active and are younger than 66 years of age.  You are older than 66 years of age and your health care provider tells you that you are at risk for this type of infection.  Your sexual activity has changed since you were last screened, and you are at increased risk for chlamydia or gonorrhea. Ask your health care provider if you are at risk.  Ask your health care provider about whether you are at high risk for HIV. Your health care provider  may recommend a prescription medicine to help prevent HIV infection. If you choose to take medicine to prevent HIV, you should first get tested for HIV. You should then be tested every 3 months for as long as you are taking the medicine.  Follow these instructions at home:  Alcohol use  Do not drink alcohol if your health care provider tells you not to drink.  If you drink alcohol:  Limit how much you have to 0-2 drinks a day.  Know how much alcohol is in your drink. In the [place] one drink equals one 12 oz bottle of beer (355 mL), one 5 oz glass of wine (148 mL), or one 1 oz glass of hard liquor (44 mL).  Lifestyle  Do not use any products that contain nicotine or tobacco. These products include cigarettes, chewing tobacco, and vaping devices, such as e-cigarettes. If you need help quitting, ask your health care provider.  Do not use street drugs.  Do not share needles.  Ask your health care provider for help if you need support or information about quitting drugs.  General instructions  Schedule regular health, dental, and eye exams.  Stay current with your vaccines.  Tell your health care provider if:  You often feel depressed.  You have ever been abused or do not feel safe at home.  Summary  Adopting a healthy lifestyle and getting preventive care are important in promoting health and wellness.  Follow your health care provider's instructions about healthy diet, exercising, and getting tested or screened for diseases.  Follow your health care provider's instructions on monitoring your cholesterol and blood pressure.  This information is not intended to replace advice given to you by your health care provider. Make sure you discuss any questions you have with your health care provider.  Document Revised: 12/31/2020 Document Reviewed: 12/31/2020  Elsevier Patient Education  2024 [employer].

## 2023-10-19 NOTE — Progress Notes (Unsigned)
 Complete physical exam  Patient: Lawrence Wang   DOB: [birth year]   68 y.o. Male  MRN: [id number]  Subjective:    Chief Complaint  Patient presents with  . Annual Exam    Lawrence Wang is a 68 y.o. male who presents today for a complete physical exam. He reports consuming a general diet. Home exercise routine includes walking 0.5 hrs per day. He generally feels well. He reports sleeping fairly well. Gets around 7 hours, occasional nighttime awakenings that are random. He does not have additional problems to discuss today.    Most recent fall risk assessment:    04/20/2023   11:43 AM  Fall Risk   Falls in the past year? 0  Number falls in past yr: 0  Injury with Fall? 0  Follow up Falls evaluation completed     Most recent depression screenings:    04/20/2023   11:43 AM 05/16/2022    8:33 AM  PHQ 2/9 Scores  PHQ - 2 Score 0 0  PHQ- 9 Score 0 1    Vision:Within last year and Dental: No current dental problems and Receives regular dental care  Patient Active Problem List   Diagnosis Date Noted  . Acute pain of right knee 08/07/2023  . Hyperlipidemia 08/07/2023  . Acute maxillary sinusitis 07/04/2022  . Psoriasis 05/16/2022  . Hypertension 04/19/2015  . GERD (gastroesophageal reflux disease) 04/19/2015  . Activities involving scuba diving 09/08/2014      Patient Care Team: Karie Georges, MD as PCP - General (Family Medicine)   Outpatient Medications Prior to Visit  Medication Sig  . Apremilast (OTEZLA) 30 MG TABS Take 1 tablet by mouth twice a day  . atorvastatin (LIPITOR) 40 MG tablet Take 1 tablet (40 mg total) by mouth daily.  . benazepril (LOTENSIN) 10 MG tablet Take 1 tablet (10 mg total) by mouth daily. Patient needs appointment.  . calcipotriene (DOVONOX) 0.005 % ointment Apply small amount to skin twice a day  . Flaxseed, Linseed, (FLAX SEEDS PO) Take 1 capsule by mouth daily.  . Multiple Vitamin (MULTIVITAMIN) capsule Take 1 capsule by mouth  daily.  Marland Kitchen omeprazole (PRILOSEC) 20 MG capsule Take 1 capsule (20 mg total) by mouth every other day. (Patient taking differently: Take 20 mg by mouth daily.)  . sildenafil (VIAGRA) 25 MG tablet TAKE 1-3 TABLETS BY MOUTH AS NEEDED PRIOR TO SEXUAL INTERCOURSE. DO NOT USE MORE THEN 1 DOSE IN 24 HOURS   No facility-administered medications prior to visit.    Review of Systems  HENT:  Negative for hearing loss.   Eyes:  Negative for blurred vision.  Respiratory:  Negative for shortness of breath.   Cardiovascular:  Negative for chest pain.  Gastrointestinal: Negative.   Genitourinary: Negative.   Musculoskeletal:  Negative for back pain.  Neurological:  Negative for headaches.  Psychiatric/Behavioral:  Negative for depression.   All other systems reviewed and are negative.      Objective:     BP 120/60   Pulse 71   Temp 98.3 F (36.8 C) (Oral)   Ht 6\' 3"  (1.905 m)   Wt 223 lb 3.2 oz (101.2 kg)   SpO2 100%   BMI 27.90 kg/m     Physical Exam Vitals reviewed.  Constitutional:      Appearance: Normal appearance. He is well-groomed and normal weight.  HENT:     Right Ear: Tympanic membrane and ear canal normal.     Left Ear: Tympanic membrane and  ear canal normal.     Mouth/Throat:     Mouth: Mucous membranes are moist.     Pharynx: No posterior oropharyngeal erythema.  Eyes:     Extraocular Movements: Extraocular movements intact.     Conjunctiva/sclera: Conjunctivae normal.  Neck:     Thyroid: No thyromegaly.  Cardiovascular:     Rate and Rhythm: Normal rate and regular rhythm.     Heart sounds: S1 normal and S2 normal. No murmur heard. Pulmonary:     Effort: Pulmonary effort is normal.     Breath sounds: Normal breath sounds and air entry. No rales.  Abdominal:     General: Abdomen is flat. Bowel sounds are normal.  Musculoskeletal:     Right lower leg: No edema.     Left lower leg: No edema.  Lymphadenopathy:     Cervical: No cervical adenopathy.   Neurological:     General: No focal deficit present.     Mental Status: He is alert and oriented to person, place, and time.     Gait: Gait is intact.  Psychiatric:        Mood and Affect: Mood and affect normal.     No results found for any visits on 10/19/23.      Assessment & Plan:    Routine Health Maintenance and Physical Exam  Immunization History  Administered Date(s) Administered  . Fluad Trivalent(High Dose 65+) 04/20/2023  . Influenza,inj,Quad PF,6+ Mos 05/16/2022  . Influenza-Unspecified 05/25/2017, 08/24/2018, 04/26/2019, 05/24/2021  . PFIZER(Purple Top)SARS-COV-2 Vaccination 11/09/2019, 11/30/2019, 06/29/2020, 12/14/2020  . PNEUMOCOCCAL CONJUGATE-20 08/07/2023  . [employer], [employer] 51yrs & up 05/24/2021  . Tdap 03/13/2016    Health Maintenance  Topic Date Due  . Zoster Vaccines- Shingrix (1 of 2) Never done  . COVID-19 Vaccine (6 - 2024-25 season) 04/26/2023  . HIV Screening  04/18/2025 (Originally 12/07/1970)  . Colonoscopy  11/03/2024  . DTaP/Tdap/Td (2 - Td or Tdap) 03/13/2026  . Pneumonia Vaccine 57+ Years old  Completed  . INFLUENZA VACCINE  Completed  . Hepatitis C Screening  Completed  . HPV VACCINES  Aged Out    Discussed health benefits of physical activity, and encouraged him to engage in regular exercise appropriate for his age and condition.  Routine general medical examination at a health care facility    Return in about 1 year (around 10/18/2024) for annual physical exam.     Karie Georges, MD

## 2023-10-22 NOTE — Progress Notes (Unsigned)
  Cardiology Office Note:  .   Date:   07/14/23  ID:  Lawrence, Wang Dec 01, 1955, MRN [id number] PCP: Lawrence Georges, MD  [facility] Providers Cardiologist:  None    History of Present Illness: .   Lawrence Wang is a 68 y.o. 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  Gets some exercise   Walks on occasion , outside and treadmill    Father had CABG in his 15s    Oct 23, 2203  Lawrence Wang is seen for follow up of his abnormal ECG  Has a small aneurism of the ascending aorta measuring 4.3 cm  No CP , no dyspnea   Was cautioned about using Fluoroquinolone antibiotics    Patient was warned about not using Cipro and similar antibiotics. Recent studies have raised concern that fluoroquinolone antibiotics could be associated with an increased risk of aortic aneurysm Fluoroquinolones have non-antimicrobial properties that might jeopardise the integrity of the extracellular matrix of the vascular wall In a  propensity score matched cohort study in [place], there was a 66% increased rate of aortic aneurysm or dissection associated with oral fluoroquinolone use, compared with amoxicillin use, within a 60 day risk period from start of treatment       ROS:   Studies Reviewed: .         Risk Assessment/Calculations:             Physical Exam:   VS:  BP 130/88 (BP Location: Left Arm, Patient Position: Sitting, Cuff Size: Normal)   Pulse 82   Resp 16   Ht 6\' 3"  (1.905 m)   Wt 222 lb (100.7 kg)   SpO2 97%   BMI 27.75 kg/m    Wt Readings from Last 3 Encounters:  10/23/23 222 lb (100.7 kg)  10/19/23 223 lb 3.2 oz (101.2 kg)  08/07/23 224 lb 3.2 oz (101.7 kg)    GEN: Well nourished, well developed in no acute distress NECK: No JVD; No carotid bruits CARDIAC: RRR, no murmurs,  rubs, gallops RESPIRATORY:  Clear to auscultation without rales, wheezing or rhonchi  ABDOMEN: Soft, non-tender, non-distended EXTREMITIES:  No edema; No deformity   ASSESSMENT AND PLAN: .   1.  Abnormal EKG: Lawrence Wang is a very healthy middle-aged gentleman.  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  On his last physical he was found to have an extra heartbeat.    Is likely a benign PVC or PAC .  No spiecific follow up needed.   2.  Family history of coronary artery disease: His father had history of bypass grafting in his 58s.    Lawrence Wang's CAC score is 270 ( 55th percentile for age / sex matched controls His LDL goal is now < 70  We started Atorva 40 in Dec.  Will recheck lipids and ALT in 1-2 months     3.  Aortic aneurism :   Repeat CTA of his aorta in 1 year           Dispo: 3 months   Signed, Lawrence Miss, MD

## 2023-10-23 ENCOUNTER — Ambulatory Visit: Payer: Commercial Managed Care - PPO | Attending: Cardiovascular Disease | Admitting: Cardiovascular Disease

## 2023-10-23 VITALS — BP 130/88 | HR 82 | Resp 16 | Ht 75.0 in | Wt 222.0 lb

## 2023-10-23 DIAGNOSIS — E782 Mixed hyperlipidemia: Secondary | ICD-10-CM | POA: Diagnosis not present

## 2023-10-23 DIAGNOSIS — I77819 Aortic ectasia, unspecified site: Secondary | ICD-10-CM | POA: Diagnosis not present

## 2023-10-23 NOTE — Patient Instructions (Addendum)
 Medication Instructions:  Your physician recommends that you continue on your current medications as directed. Please refer to the Current Medication list given to you today.  *If you need a refill on your cardiac medications before your next appointment, please call your pharmacy*  Lab Work: In 1 month at [facility]: Lipid panel, ALT You may go to any of these [facility] locations:   [facility] - [address] ([facility]) - [address] [phone] [address]  If you have labs (blood work) drawn today and your tests are completely normal, you will receive your results only by: [employer] (if you have MyChart) OR A paper copy in the mail If you have any lab test that is abnormal or we need to change your treatment, we will call you to review the results.  Testing/Procedures: Your physician has requested that you have a CT Angiography (CTA) of your aorta in 1 year. This is a special type of CT scan that uses a computer to produce multi-dimensional views of major blood vessels throughout the body. In CT angiography, a contrast material is injected through an IV to help visualize the blood vessels   Follow-Up: At [facility], you and your health needs are our priority.  As part of our continuing mission to provide you with exceptional heart care, we have created designated Provider Care Teams.  These Care Teams include your primary Cardiologist (physician) and Advanced Practice Providers (APPs -  Physician Assistants and Nurse Practitioners) who all work together to provide you with the care you need, when you need it.  Your next appointment:   1 year(s) (2-4 weeks after CT angiogram)  The format for your next appointment:   In Person  Provider:   Kristeen Miss, MD (will be with new cardiologist) {  Other Instructions Patient was warned about not using Cipro and similar antibiotics. Recent studies have raised concern that  fluoroquinolone antibiotics could be associated with an increased risk of aortic aneurysm Fluoroquinolones have non-antimicrobial properties that might jeopardise the integrity of the extracellular matrix of the vascular wall In a  propensity score matched cohort study in [place], there was a 66% increased rate of aortic aneurysm or dissection associated with oral fluoroquinolone use, compared with amoxicillin use, within a 60 day risk period from start of treatment     1st Floor: - Lobby - Registration  - Pharmacy  - Lab - Cafe  2nd Floor: - PV Lab - Diagnostic Testing (echo, CT, nuclear med)  3rd Floor: - Vacant  4th Floor: - TCTS (cardiothoracic surgery) - AFib Clinic - Structural Heart Clinic - Vascular Surgery  - Vascular Ultrasound  5th Floor: - HeartCare Cardiology (general and EP) - Clinical Pharmacy for coumadin, hypertension, lipid, weight-loss medications, and med management appointments    Valet parking services will be available as well.

## 2023-10-26 ENCOUNTER — Other Ambulatory Visit (HOSPITAL_COMMUNITY): Payer: Self-pay

## 2023-10-27 ENCOUNTER — Other Ambulatory Visit: Payer: Self-pay

## 2023-11-19 ENCOUNTER — Other Ambulatory Visit: Payer: Self-pay

## 2023-11-20 ENCOUNTER — Other Ambulatory Visit: Payer: Self-pay

## 2023-11-20 NOTE — Progress Notes (Signed)
 Specialty Pharmacy Refill Coordination Note  Lawrence Wang is a 68 y.o. male contacted today regarding refills of specialty medication(s) Apremilast [name]   Patient requested (Patient-Rptd) [name] at [facility] at [facility] date: (Patient-Rptd) 11/30/23   Medication will be filled on 11/27/23.

## 2023-11-23 DIAGNOSIS — E782 Mixed hyperlipidemia: Secondary | ICD-10-CM | POA: Diagnosis not present

## 2023-11-24 ENCOUNTER — Telehealth: Payer: Self-pay

## 2023-11-24 ENCOUNTER — Encounter: Payer: Self-pay | Admitting: Cardiovascular Disease

## 2023-11-24 ENCOUNTER — Other Ambulatory Visit (HOSPITAL_COMMUNITY): Payer: Self-pay

## 2023-11-24 DIAGNOSIS — Z79899 Other long term (current) drug therapy: Secondary | ICD-10-CM

## 2023-11-24 DIAGNOSIS — E782 Mixed hyperlipidemia: Secondary | ICD-10-CM

## 2023-11-24 LAB — LIPID PANEL
Chol/HDL Ratio: 3 ratio (ref 0.0–5.0)
Cholesterol, Total: 150 mg/dL (ref 100–199)
HDL: 50 mg/dL (ref >39)
LDL Chol Calc (NIH): 87 mg/dL (ref 0–99)
Triglycerides: 63 mg/dL (ref 0–149)
VLDL Cholesterol Cal: 13 mg/dL (ref 5–40)

## 2023-11-24 LAB — ALT: ALT: 32 IU/L (ref 0–44)

## 2023-11-24 MED ORDER — EZETIMIBE 10 MG PO TABS
10.0000 mg | ORAL_TABLET | Freq: Every day | ORAL | 3 refills | Status: AC
  Filled 2023-11-24: qty 90, 90d supply, fill #0
  Filled 2024-02-15: qty 90, 90d supply, fill #1
  Filled 2024-05-16: qty 90, 90d supply, fill #2
  Filled 2024-08-10: qty 90, 90d supply, fill #3

## 2023-11-24 NOTE — Telephone Encounter (Signed)
 Called and spoke with patient regarding addition of zetia. He agrees to plan. Medication sent to pharmacy and labs placed and released for him to get drawn in July.

## 2023-11-24 NOTE — Telephone Encounter (Signed)
-----   Message from Kristeen Miss sent at 11/24/2023  8:30 AM EDT ----- LDL is 87. He has coronary artery calcifications .  His LDL goal is < 70 Please add Zetia 10 mg a day Check lipids, ALT in 3 months  Continue healthy diet, exercise program

## 2023-11-27 ENCOUNTER — Other Ambulatory Visit: Payer: Self-pay

## 2023-12-03 ENCOUNTER — Other Ambulatory Visit: Payer: Self-pay | Admitting: Internal Medicine

## 2023-12-03 ENCOUNTER — Other Ambulatory Visit (HOSPITAL_COMMUNITY): Payer: Self-pay

## 2023-12-03 ENCOUNTER — Other Ambulatory Visit: Payer: Self-pay

## 2023-12-18 ENCOUNTER — Encounter: Payer: Self-pay | Admitting: Pharmacist

## 2023-12-18 ENCOUNTER — Ambulatory Visit: Attending: Family Medicine | Admitting: Pharmacist

## 2023-12-18 DIAGNOSIS — Z79899 Other long term (current) drug therapy: Secondary | ICD-10-CM

## 2023-12-18 NOTE — Progress Notes (Signed)
  S: Patient presents for review of their specialty medication therapy.  Patient is taking Otezla  for psoriasis. Patient is managed by Dr. Theron Flavin for this.   Adherence: confirms.  Efficacy: confirms that symptoms are better vs prior to therapy.   Dosing:  Active psoriatic arthritis or plaque psoriasis (moderate to severe): Oral: Initial: 10 mg in the morning. Titrate upward by additional 10 mg per day on days 2 to 5 as follows: Day 2: 10 mg twice daily; Day 3: 10 mg in the morning and 20 mg in the evening; Day 4: 20 mg twice daily; Day 5: 20 mg in the morning and 30 mg in the evening. Maintenance dose: 30 mg twice daily starting on day 6  CrCl <30 mL/minute: Initial: 10 mg in the morning on days 1 to 3; titrate using morning doses only (skip evening doses) to 20 mg on days 4 and 5. Maintenance dose: 30 mg once daily in the morning starting on day 6.    Current adverse effects: Headache: none GI upset: some but nothing unmanageable at this time.  Weight loss: none Neuropsychiatric effects: none  O:     Lab Results  Component Value Date   WBC 5.1 09/02/2021   HGB 15.3 (A) 04/29/2023   HCT 47.2 09/02/2021   MCV 89.3 09/02/2021   PLT 211.0 09/02/2021      Chemistry      Component Value Date/Time   NA 140 08/17/2023 0808   K 4.3 08/17/2023 0808   CL 104 08/17/2023 0808   CO2 30 08/17/2023 0808   BUN 13 08/17/2023 0808   CREATININE 0.83 08/17/2023 0808   CREATININE 0.88 03/30/2020 0731      Component Value Date/Time   CALCIUM  9.3 08/17/2023 0808   ALKPHOS 15 (L) 08/17/2023 0808   AST 17 08/17/2023 0808   ALT 32 11/23/2023 0857   BILITOT 0.8 08/17/2023 0808       A/P: 1. Medication review: patient is taking Otezla  for psoriasis. Reviewed the medication including the following: apremilast  inhibits phosphodiesterase 4 (PDE4) specific for cyclic adenosine monophosphate (cAMP) which results in increased intracellular cAMP levels and regulation of numerous inflammatory  mediators (eg, decreased expression of nitric oxide synthase, TNF-alpha, and interleukin [IL]-23, as well as increased IL-10. Patient educated on purpose, proper use and potential adverse effects of Otezla . Possible adverse effects include weight loss, GI upset, headache, and mood changes. Renal function should be routinely monitored. Administer without regard to food. Do not crush, chew, or split tablets. No recommendations for any changes at this time.  Marene Shape, PharmD, Becky Bowels, CPP Clinical Pharmacist [facility] [phone]

## 2023-12-22 ENCOUNTER — Other Ambulatory Visit: Payer: Self-pay | Admitting: Pharmacy Technician

## 2023-12-22 ENCOUNTER — Other Ambulatory Visit: Payer: Self-pay

## 2023-12-22 ENCOUNTER — Other Ambulatory Visit (HOSPITAL_COMMUNITY): Payer: Self-pay

## 2023-12-22 ENCOUNTER — Other Ambulatory Visit: Payer: Self-pay | Admitting: Pharmacist

## 2023-12-22 MED ORDER — OTEZLA 30 MG PO TABS
1.0000 | ORAL_TABLET | Freq: Two times a day (BID) | ORAL | 10 refills | Status: DC
Start: 2023-12-22 — End: 2023-12-22

## 2023-12-22 MED ORDER — OTEZLA 30 MG PO TABS
1.0000 | ORAL_TABLET | Freq: Two times a day (BID) | ORAL | 10 refills | Status: AC
  Filled 2023-12-22 – 2023-12-29 (×2): qty 60, 30d supply, fill #0
  Filled 2024-01-19: qty 60, 30d supply, fill #1
  Filled 2024-02-22: qty 60, 30d supply, fill #2
  Filled 2024-03-11: qty 60, 30d supply, fill #3
  Filled 2024-04-11: qty 60, 30d supply, fill #4
  Filled 2024-05-23: qty 60, 30d supply, fill #5
  Filled 2024-06-17 (×2): qty 60, 30d supply, fill #6
  Filled 2024-07-22: qty 60, 30d supply, fill #7
  Filled 2024-08-17 – 2024-08-22 (×2): qty 60, 30d supply, fill #8
  Filled 2024-09-23: qty 60, 30d supply, fill #9

## 2023-12-22 NOTE — Progress Notes (Signed)
 Specialty Pharmacy Refill Coordination Note  Lawrence Wang is a 68 y.o. male contacted today regarding refills of specialty medication(s) Apremilast  (Otezla )   Patient requested (Patient-Rptd) Pickup at [facility] at [facility] date: (Patient-Rptd) 12/25/23   Medication will be filled on 12/24/23.     This fill date is pending response to refill request from provider. Left voicemail for Patient if they have not received fill by intended date they must follow up with pharmacy.   Sent rr, rewrite required.

## 2023-12-24 ENCOUNTER — Other Ambulatory Visit: Payer: Self-pay

## 2023-12-25 ENCOUNTER — Other Ambulatory Visit: Payer: Self-pay

## 2023-12-25 NOTE — Progress Notes (Signed)
 Patient has to call copay card. Pt notified through MyChart message

## 2023-12-29 ENCOUNTER — Other Ambulatory Visit (HOSPITAL_COMMUNITY): Payer: Self-pay

## 2023-12-29 ENCOUNTER — Other Ambulatory Visit: Payer: Self-pay

## 2023-12-29 NOTE — Progress Notes (Signed)
 Patient called back copay card issue has been resolved, Claim has been reran for a $0 copay. Medication will be filled today 12/29/23 for pick up at [facility] .

## 2023-12-30 ENCOUNTER — Other Ambulatory Visit (HOSPITAL_COMMUNITY): Payer: Self-pay

## 2024-01-11 NOTE — Progress Notes (Signed)
 Office Visit Note   Patient: Lawrence Wang           Date of Birth: [birth year]           MRN: [id number] Visit Date: 01/12/2024              Requested by: Aida House, MD [address] [place],  [place] [address] PCP: Aida House, MD   Assessment & Plan: Visit Diagnoses:  1. Chronic pain of right knee     Plan: History of Present Illness Lawrence Wang is a 68 year old male with chondrocalcinosis who presents with knee pain.  He experiences persistent right knee pain, managed with Tylenol  and a knee brace. He is concerned about increased Tylenol  use during an upcoming trip involving significant walking. A previous injection provided significant relief for two months. He is cautious about exercising due to pain exacerbation concerns. Examination reveals no swelling, good range of motion, and tenderness on the medial side of the knee.  Physical Exam MUSCULOSKELETAL: No swelling, good range of motion in knee. Tenderness on medial side of knee.  Assessment and Plan Right knee pain Osteoarthritis vs medial meniscus tear. Previous injection effective for two months. Reassured there's no evidence of metabolic bone disease. - Administer knee injection for pain relief.  Follow-Up Instructions: No follow-ups on file.   Orders:  Orders Placed This Encounter  Procedures   Large Joint Inj   No orders of the defined types were placed in this encounter.     Procedures: Large Joint Inj: R knee on 01/12/2024 5:27 PM Indications: pain Details: 22 G needle  Arthrogram: No  Medications: 40 mg methylPREDNISolone  acetate 40 MG/ML; 2 mL lidocaine  1 %; 2 mL bupivacaine  0.5 % Consent was given by the patient. Patient was prepped and draped in the usual sterile fashion.       Clinical Data: No additional findings.   Subjective: Chief Complaint  Patient presents with   Right Knee - Pain    HPI  Review of Systems  Constitutional: Negative.   HENT:  Negative.    Eyes: Negative.   Respiratory: Negative.    Cardiovascular: Negative.   Gastrointestinal: Negative.   Endocrine: Negative.   Genitourinary: Negative.   Skin: Negative.   Allergic/Immunologic: Negative.   Neurological: Negative.   Hematological: Negative.   Psychiatric/Behavioral: Negative.    All other systems reviewed and are negative.    Objective: Vital Signs: There were no vitals taken for this visit.  Physical Exam Vitals and nursing note reviewed.  Constitutional:      Appearance: He is well-developed.  Pulmonary:     Effort: Pulmonary effort is normal.  Abdominal:     Palpations: Abdomen is soft.  Skin:    General: Skin is warm.  Neurological:     Mental Status: He is alert and oriented to person, place, and time.  Psychiatric:        Behavior: Behavior normal.        Thought Content: Thought content normal.        Judgment: Judgment normal.     Ortho Exam  Specialty Comments:  No specialty comments available.  Imaging: No results found.   PMFS History: Patient Active Problem List   Diagnosis Date Noted   Acute pain of right knee 08/07/2023   Hyperlipidemia 08/07/2023   Acute maxillary sinusitis 07/04/2022   Psoriasis 05/16/2022   Hypertension 04/19/2015   GERD (gastroesophageal reflux disease) 04/19/2015   Activities involving scuba  diving 09/08/2014   Past Medical History:  Diagnosis Date   GERD (gastroesophageal reflux disease)    Hypertension    Psoriasis    diagnosed by [facility] per patient    Family History  Problem Relation Age of Onset   Cervical cancer Mother 18   High blood pressure Mother    Thyroid disease Mother    Heart block Father    CAD Father 58       +smoker   Lung cancer Brother    Heart attack Maternal Grandfather        age unknown; 17's?   Throat cancer Paternal Grandmother    Colon polyps Neg Hx    Colon cancer Neg Hx    Esophageal cancer Neg Hx    Rectal cancer Neg Hx    Stomach cancer  Neg Hx     Past Surgical History:  Procedure Laterality Date   APPENDECTOMY     INGUINAL HERNIA REPAIR Bilateral 09/23/2021   Procedure: LAPAROSCOPIC BILATERAL INGUINAL HERNIA REPAIR WITH MESH;  Surgeon: Shela Derby, MD;  Location: [facility] OR;  Service: General;  Laterality: Bilateral;   INSERTION OF MESH Bilateral 09/23/2021   Procedure: INSERTION OF MESH;  Surgeon: Shela Derby, MD;  Location: [facility] OR;  Service: General;  Laterality: Bilateral;   NASAL SEPTUM SURGERY     TONSILLECTOMY     UVULECTOMY     Social History   Occupational History   Not on file  Tobacco Use   Smoking status: Never   Smokeless tobacco: Never  Vaping Use   Vaping status: Never Used  Substance and Sexual Activity   Alcohol use: Yes    Alcohol/week: 0.0 standard drinks of alcohol    Comment: glass of wine 4 nights per week   Drug use: No   Sexual activity: Not on file

## 2024-01-12 ENCOUNTER — Ambulatory Visit: Admitting: Orthopaedic Surgery

## 2024-01-12 DIAGNOSIS — G8929 Other chronic pain: Secondary | ICD-10-CM | POA: Diagnosis not present

## 2024-01-12 DIAGNOSIS — M25561 Pain in right knee: Secondary | ICD-10-CM | POA: Diagnosis not present

## 2024-01-12 MED ORDER — LIDOCAINE HCL 1 % IJ SOLN
2.0000 mL | INTRAMUSCULAR | Status: AC | PRN
  Administered 2024-01-12: 2 mL

## 2024-01-12 MED ORDER — BUPIVACAINE HCL 0.5 % IJ SOLN
2.0000 mL | INTRAMUSCULAR | Status: AC | PRN
  Administered 2024-01-12: 2 mL via INTRA_ARTICULAR

## 2024-01-12 MED ORDER — METHYLPREDNISOLONE ACETATE 40 MG/ML IJ SUSP
40.0000 mg | INTRAMUSCULAR | Status: AC | PRN
  Administered 2024-01-12: 40 mg via INTRA_ARTICULAR

## 2024-01-13 ENCOUNTER — Other Ambulatory Visit: Payer: Self-pay

## 2024-01-19 ENCOUNTER — Other Ambulatory Visit: Payer: Self-pay

## 2024-01-19 ENCOUNTER — Other Ambulatory Visit (HOSPITAL_COMMUNITY): Payer: Self-pay

## 2024-01-19 NOTE — Progress Notes (Signed)
 Specialty Pharmacy Ongoing Clinical Assessment Note  Kipp Shank is a 68 y.o. male who is being followed by the specialty pharmacy service for RxSp Psoriasis   Patient's specialty medication(s) reviewed today: Apremilast  (Otezla )   Missed doses in the last 4 weeks: 0   Patient/Caregiver did not have any additional questions or concerns.   Therapeutic benefit summary: Patient is achieving benefit   Adverse events/side effects summary: No adverse events/side effects   Patient's therapy is appropriate to: Continue    Goals Addressed             This Visit's Progress    Minimize recurrence of flares   On track    Patient is on track. Patient will maintain adherence.  Mr. Dudek reports no recent flares and his psoriasis patches have stabilized but are not entire resolved.          Follow up: 6 months  [facility]

## 2024-01-19 NOTE — Progress Notes (Signed)
 Specialty Pharmacy Refill Coordination Note  Lawrence Wang is a 67 y.o. male contacted today regarding refills of specialty medication(s) Apremilast  (Otezla )   Patient requested Azarel Banner Dk at [facility] at [place] date: 01/21/24   Medication will be filled on 01/20/24.

## 2024-01-20 ENCOUNTER — Other Ambulatory Visit: Payer: Self-pay

## 2024-02-08 ENCOUNTER — Ambulatory Visit: Admitting: Family Medicine

## 2024-02-08 ENCOUNTER — Encounter: Payer: Self-pay | Admitting: Family Medicine

## 2024-02-08 ENCOUNTER — Ambulatory Visit: Payer: Self-pay

## 2024-02-08 VITALS — BP 120/82 | HR 87 | Temp 98.1°F | Ht 75.6 in | Wt 218.6 lb

## 2024-02-08 DIAGNOSIS — K6289 Other specified diseases of anus and rectum: Secondary | ICD-10-CM | POA: Diagnosis not present

## 2024-02-08 DIAGNOSIS — Z8719 Personal history of other diseases of the digestive system: Secondary | ICD-10-CM

## 2024-02-08 NOTE — Telephone Encounter (Signed)
 Patient was scheduled for an appt today with Dr Arliss Lam.

## 2024-02-08 NOTE — Telephone Encounter (Signed)
 Noted- ok to close.

## 2024-02-08 NOTE — Progress Notes (Signed)
 Established Patient Office Visit   Subjective  Patient ID: Lawrence Wang, male    DOB: 1955-09-24  Age: 68 y.o. MRN: [id number]  Chief Complaint  Patient presents with   Medical Management of Chronic Issues    Rectal pain for about 4 weeks now no bleeding or discharge     Pt is a 68 yo male followed by Dr. Bambi Lever and seen for ongoing concern.  Patient endorses rectal pain/pressure noted as sharp/internal sensation x 1 month.  Partially relieved by passing gas or with BM.  Patient denies blood in stools, hemorrhoids, nausea, vomiting, changes in meds or foods.  Having a BM daily or every 2 days.  Occasionally has to sit in toilet for a while with some straining.  Taking Prilosec 20 mg for GERD.  Colonoscopy done 11/04/2019.  Took a stool softener yesterday.    Patient Active Problem List   Diagnosis Date Noted   Acute pain of right knee 08/07/2023   Hyperlipidemia 08/07/2023   Acute maxillary sinusitis 07/04/2022   Psoriasis 05/16/2022   Hypertension 04/19/2015   GERD (gastroesophageal reflux disease) 04/19/2015   Activities involving scuba diving 09/08/2014   Past Medical History:  Diagnosis Date   GERD (gastroesophageal reflux disease)    Hypertension    Psoriasis    diagnosed by [facility] per patient   Past Surgical History:  Procedure Laterality Date   APPENDECTOMY     INGUINAL HERNIA REPAIR Bilateral 09/23/2021   Procedure: LAPAROSCOPIC BILATERAL INGUINAL HERNIA REPAIR WITH MESH;  Surgeon: Shela Derby, MD;  Location: [facility] OR;  Service: General;  Laterality: Bilateral;   INSERTION OF MESH Bilateral 09/23/2021   Procedure: INSERTION OF MESH;  Surgeon: Shela Derby, MD;  Location: [facility] OR;  Service: General;  Laterality: Bilateral;   NASAL SEPTUM SURGERY     TONSILLECTOMY     UVULECTOMY     Social History   Tobacco Use   Smoking status: Never   Smokeless tobacco: Never  Vaping Use   Vaping status: Never Used  Substance Use Topics   Alcohol use: Yes     Alcohol/week: 0.0 standard drinks of alcohol    Comment: glass of wine 4 nights per week   Drug use: No   Family History  Problem Relation Age of Onset   Cervical cancer Mother 74   High blood pressure Mother    Thyroid disease Mother    Heart block Father    CAD Father 69       +smoker   Lung cancer Brother    Heart attack Maternal Grandfather        age unknown; 82's?   Throat cancer Paternal Grandmother    Colon polyps Neg Hx    Colon cancer Neg Hx    Esophageal cancer Neg Hx    Rectal cancer Neg Hx    Stomach cancer Neg Hx    Allergies  Allergen Reactions   Cipro  [Ciprofloxacin  Hcl] Other (See Comments)    Hx of aortic aneurism     Peanuts [Peanut Oil] Other (See Comments)    Headaches   Zithromax [Azithromycin] Other (See Comments)    Hiccups-per patient    ROS Negative unless stated above    Objective:     BP 120/82 (BP Location: Left Arm, Patient Position: Sitting, Cuff Size: Normal)   Pulse 87   Temp 98.1 F (36.7 C) (Oral)   Ht 6' 3.6 (1.92 m)   Wt 218 lb 9.6 oz (99.2 kg)   SpO2  97%   BMI 26.89 kg/m  BP Readings from Last 3 Encounters:  02/08/24 120/82  10/23/23 130/88  10/19/23 120/60   Wt Readings from Last 3 Encounters:  02/08/24 218 lb 9.6 oz (99.2 kg)  10/23/23 222 lb (100.7 kg)  10/19/23 223 lb 3.2 oz (101.2 kg)      Physical Exam Constitutional:      General: He is not in acute distress.    Appearance: Normal appearance.  HENT:     Head: Normocephalic and atraumatic.     Nose: Nose normal.     Mouth/Throat:     Mouth: Mucous membranes are moist.   Cardiovascular:     Rate and Rhythm: Normal rate and regular rhythm.     Heart sounds: Normal heart sounds. No murmur heard.    No gallop.  Pulmonary:     Effort: Pulmonary effort is normal. No respiratory distress.     Breath sounds: Normal breath sounds. No wheezing, rhonchi or rales.  Abdominal:     General: Bowel sounds are normal.     Palpations: Abdomen is soft.      Tenderness: There is no abdominal tenderness. There is no guarding or rebound.   Skin:    General: Skin is warm and dry.   Neurological:     Mental Status: He is alert and oriented to person, place, and time.     No results found for any visits on 02/08/24.    Assessment & Plan:   Rectal pain -     Ambulatory referral to Gastroenterology  History of hemorrhoids   Rectal pain x 1 month.  Discussed possible causes including hemorrhoids, constipation, etc.  Prior history of hemorrhoids status post banding.  Colonoscopy done 11/04/2019 with 5-year recall due in 2026.  Discussed daily bowel regimen.  Patient to monitor symptoms and association with food.  Referral to GI placed.  Given strict precautions.  Follow-up with PCP as needed  Return if symptoms worsen or fail to improve.   Viola Greulich, MD

## 2024-02-08 NOTE — Telephone Encounter (Signed)
 FYI Only or Action Required?: FYI only for provider  Patient was last seen in primary care on 10/19/2023 by Aida House, MD. Called Nurse Triage reporting Rectal Pain. Symptoms began about a month ago. Interventions attempted: Nothing. Symptoms are: gradually worsening.  Triage Disposition: See Physician Within 24 Hours  Patient/caregiver understands and will follow disposition?: Yes            Copied from CRM [id number]. Topic: Clinical - Red Word Triage >> Feb 08, 2024  8:23 AM Lawrence Wang I wrote: Red Word that prompted transfer to Nurse Triage: Patient is experiencing worsen pain in bowels and is stating it is painful to use the bathroom and sit down, States that it is enough pain that he is wimping. Reason for Disposition  MODERATE-SEVERE rectal pain (i.e., interferes with school, work, or sleep)  Answer Assessment - Initial Assessment Questions 1. SYMPTOM:  What's the main symptom you're concerned about? (e.g., pain, itching, swelling, rash)     Pain - with BM or passing gas 2. ONSET: When did the s/s  start?     1 month 3. RECTAL PAIN: Do you have any pain around your rectum? How bad is the pain?  (Scale 0-10; or mild, moderate, severe)   - NONE (0): no pain   - MILD (1-3): doesn't interfere with normal activities    - MODERATE (4-7): interferes with normal activities or awakens from sleep, limping    - SEVERE (8-10): excruciating pain, unable to have a bowel movement      All the time - pain increases a lot with sitting, gas or BM 4. RECTAL ITCHING: Do you have any itching in this area? How bad is the itching?  (Scale 0-10; or mild, moderate, severe)   - NONE: no itching   - MILD: doesn't interfere with normal activities    - MODERATE-SEVERE: interferes with normal activities or awakens from sleep     Did not ask 5. CONSTIPATION: Do you have constipation? If Yes, ask: How bad is it?     no 6. CAUSE: What do you think is causing the anus symptoms?      Unsure has had hemorrhoids in the past 7. OTHER SYMPTOMS: Do you have any other symptoms?  (e.g., abdomen pain, fever, rectal bleeding, vomiting)     no  Protocols used: Rectal Symptoms-A-AH

## 2024-02-19 ENCOUNTER — Other Ambulatory Visit (HOSPITAL_COMMUNITY): Payer: Self-pay

## 2024-02-19 ENCOUNTER — Encounter (INDEPENDENT_AMBULATORY_CARE_PROVIDER_SITE_OTHER): Payer: Self-pay

## 2024-02-19 ENCOUNTER — Other Ambulatory Visit: Payer: Self-pay

## 2024-02-22 ENCOUNTER — Other Ambulatory Visit: Payer: Self-pay

## 2024-02-22 ENCOUNTER — Other Ambulatory Visit (HOSPITAL_COMMUNITY): Payer: Self-pay

## 2024-02-22 NOTE — Progress Notes (Signed)
 Specialty Pharmacy Refill Coordination Note  Lawrence Wang is a 68 y.o. male contacted today regarding refills of specialty medication(s) Apremilast  (Otezla )   Patient requested [name] at [facility] at [place] date: 02/22/24   Medication will be filled on 02/22/24.

## 2024-02-23 ENCOUNTER — Encounter: Payer: Self-pay | Admitting: Cardiovascular Disease

## 2024-03-11 ENCOUNTER — Other Ambulatory Visit: Payer: Self-pay

## 2024-03-11 NOTE — Progress Notes (Signed)
 Specialty Pharmacy Refill Coordination Note  Lawrence Wang is a 68 y.o. male contacted today regarding refills of specialty medication(s) Apremilast  (Otezla )   Patient requested [name] at [facility] at [place] date: 03/18/24   Medication will be filled on 03/17/24.

## 2024-03-17 ENCOUNTER — Other Ambulatory Visit: Payer: Self-pay

## 2024-04-09 ENCOUNTER — Encounter (INDEPENDENT_AMBULATORY_CARE_PROVIDER_SITE_OTHER): Payer: Self-pay

## 2024-04-11 ENCOUNTER — Other Ambulatory Visit: Payer: Self-pay

## 2024-04-11 NOTE — Progress Notes (Signed)
 Specialty Pharmacy Refill Coordination Note  Lawrence Wang is a 68 y.o. male contacted today regarding refills of specialty medication(s) Apremilast  (Otezla )   Patient requested [name] at [facility] at [place] date: 04/29/24   Medication will be filled on 09.04.25.

## 2024-04-12 ENCOUNTER — Ambulatory Visit: Admitting: Physician Assistant

## 2024-04-28 ENCOUNTER — Other Ambulatory Visit: Payer: Self-pay

## 2024-05-16 ENCOUNTER — Other Ambulatory Visit (HOSPITAL_COMMUNITY): Payer: Self-pay

## 2024-05-16 ENCOUNTER — Other Ambulatory Visit: Payer: Self-pay | Admitting: Family Medicine

## 2024-05-16 ENCOUNTER — Other Ambulatory Visit: Payer: Self-pay

## 2024-05-16 DIAGNOSIS — I10 Essential (primary) hypertension: Secondary | ICD-10-CM

## 2024-05-16 MED ORDER — BENAZEPRIL HCL 10 MG PO TABS
10.0000 mg | ORAL_TABLET | Freq: Every day | ORAL | 11 refills | Status: AC
  Filled 2024-05-16: qty 30, 30d supply, fill #0
  Filled 2024-06-13: qty 30, 30d supply, fill #1
  Filled 2024-07-11: qty 30, 30d supply, fill #2
  Filled 2024-07-25 – 2024-08-10 (×2): qty 30, 30d supply, fill #3
  Filled 2024-09-12: qty 30, 30d supply, fill #4

## 2024-05-19 ENCOUNTER — Other Ambulatory Visit: Payer: Self-pay

## 2024-05-20 ENCOUNTER — Other Ambulatory Visit: Payer: Self-pay

## 2024-05-23 ENCOUNTER — Other Ambulatory Visit: Payer: Self-pay

## 2024-05-23 NOTE — Progress Notes (Signed)
 Specialty Pharmacy Refill Coordination Note  Lawrence Wang is a 68 y.o. male contacted today regarding refills of specialty medication(s) Apremilast  (Otezla )   Patient requested [name] at [facility] at [place] date: 05/25/24   Medication will be filled on 05/25/24.

## 2024-05-24 ENCOUNTER — Other Ambulatory Visit: Payer: Self-pay

## 2024-06-08 ENCOUNTER — Telehealth: Payer: Self-pay

## 2024-06-08 ENCOUNTER — Other Ambulatory Visit: Payer: Self-pay

## 2024-06-08 DIAGNOSIS — Z79899 Other long term (current) drug therapy: Secondary | ICD-10-CM | POA: Diagnosis not present

## 2024-06-08 DIAGNOSIS — E782 Mixed hyperlipidemia: Secondary | ICD-10-CM | POA: Diagnosis not present

## 2024-06-08 NOTE — Telephone Encounter (Signed)
 Pharmacy Patient Advocate Encounter   Received notification from Patient Pharmacy that prior authorization for Otezla  is required/requested.   Insurance verification completed.   The patient is insured through [employer].   Per test claim: PA required; PA submitted to above mentioned insurance via Latent Key/confirmation #/EOC [facility] Status is pending

## 2024-06-08 NOTE — Telephone Encounter (Signed)
 Pharmacy Patient Advocate Encounter  Received notification from [facility] that Prior Authorization for Otezla  has been APPROVED from 06/08/24 to 06/07/25   PA #/Case ID/Reference #: [id number]

## 2024-06-09 ENCOUNTER — Ambulatory Visit: Payer: Self-pay

## 2024-06-09 LAB — LIPID PANEL
Chol/HDL Ratio: 2.5 ratio (ref 0.0–5.0)
Cholesterol, Total: 118 mg/dL (ref 100–199)
HDL: 47 mg/dL (ref >39)
LDL Chol Calc (NIH): 56 mg/dL (ref 0–99)
Triglycerides: 72 mg/dL (ref 0–149)
VLDL Cholesterol Cal: 15 mg/dL (ref 5–40)

## 2024-06-09 LAB — ALT: ALT: 23 IU/L (ref 0–44)

## 2024-06-17 ENCOUNTER — Other Ambulatory Visit: Payer: Self-pay

## 2024-06-17 ENCOUNTER — Other Ambulatory Visit (HOSPITAL_COMMUNITY): Payer: Self-pay

## 2024-06-17 NOTE — Progress Notes (Signed)
 Specialty Pharmacy Refill Coordination Note  Lawrence Wang is a 68 y.o. male contacted today regarding refills of specialty medication(s) Apremilast  (Otezla )   Patient requested (Patient-Rptd) Pickup at [facility] at [place] date: 07/01/24   Medication will be filled on 06/30/24.

## 2024-06-27 ENCOUNTER — Encounter: Payer: Self-pay | Admitting: Radiology

## 2024-06-28 ENCOUNTER — Other Ambulatory Visit: Payer: Self-pay

## 2024-06-30 ENCOUNTER — Other Ambulatory Visit: Payer: Self-pay

## 2024-07-11 ENCOUNTER — Other Ambulatory Visit (HOSPITAL_COMMUNITY): Payer: Self-pay

## 2024-07-11 ENCOUNTER — Other Ambulatory Visit: Payer: Self-pay | Admitting: Family Medicine

## 2024-07-11 DIAGNOSIS — Z0000 Encounter for general adult medical examination without abnormal findings: Secondary | ICD-10-CM

## 2024-07-11 MED ORDER — SILDENAFIL CITRATE 25 MG PO TABS
ORAL_TABLET | ORAL | 2 refills | Status: AC
  Filled 2024-07-11: qty 10, 10d supply, fill #0

## 2024-07-18 ENCOUNTER — Other Ambulatory Visit: Payer: Self-pay

## 2024-07-22 ENCOUNTER — Other Ambulatory Visit: Payer: Self-pay

## 2024-07-22 ENCOUNTER — Other Ambulatory Visit (HOSPITAL_COMMUNITY): Payer: Self-pay

## 2024-07-25 ENCOUNTER — Other Ambulatory Visit (HOSPITAL_COMMUNITY): Payer: Self-pay

## 2024-07-25 ENCOUNTER — Other Ambulatory Visit: Payer: Self-pay

## 2024-07-25 ENCOUNTER — Other Ambulatory Visit: Payer: Self-pay | Admitting: Pharmacy Technician

## 2024-07-25 NOTE — Progress Notes (Signed)
 Specialty Pharmacy Refill Coordination Note  Lawrence Wang is a 68 y.o. male contacted today regarding refills of specialty medication(s)  Apremilast  (Otezla )   Patient requested (Patient-Rptd) Pickup at [facility] at [facility] date: (Patient-Rptd) 08/01/24   Medication will be filled on:07/29/2024

## 2024-07-29 ENCOUNTER — Encounter (HOSPITAL_COMMUNITY): Payer: Self-pay | Admitting: General Surgery

## 2024-07-29 ENCOUNTER — Other Ambulatory Visit: Payer: Self-pay

## 2024-08-05 ENCOUNTER — Other Ambulatory Visit: Payer: Self-pay

## 2024-08-08 ENCOUNTER — Other Ambulatory Visit: Payer: Self-pay

## 2024-08-08 ENCOUNTER — Other Ambulatory Visit (HOSPITAL_COMMUNITY): Payer: Self-pay

## 2024-08-08 ENCOUNTER — Telehealth: Payer: Self-pay | Admitting: Cardiovascular Disease

## 2024-08-08 DIAGNOSIS — Z79899 Other long term (current) drug therapy: Secondary | ICD-10-CM

## 2024-08-08 DIAGNOSIS — E782 Mixed hyperlipidemia: Secondary | ICD-10-CM

## 2024-08-08 MED ORDER — ATORVASTATIN CALCIUM 40 MG PO TABS
40.0000 mg | ORAL_TABLET | Freq: Every day | ORAL | 0 refills | Status: AC
  Filled 2024-08-08: qty 80, 80d supply, fill #0
  Filled 2024-08-08: qty 10, 10d supply, fill #0

## 2024-08-08 NOTE — Telephone Encounter (Signed)
 Refill sent

## 2024-08-08 NOTE — Telephone Encounter (Signed)
°*  STAT* If patient is at the pharmacy, call can be transferred to refill team.   1. Which medications need to be refilled? (please list name of each medication and dose if known)   atorvastatin  (LIPITOR) 40 MG tablet (Expired)    2. Would you like to learn more about the convenience, safety, & potential cost savings by using the [facility]?   3. Are you open to using the [facility] (Type [facility]. ).  4. Which pharmacy/location (including street and city if local pharmacy) is medication to be sent to?  [facility]   5. Do they need a 30 day or 90 day supply?  90 day  Patient stated he is completely out of this medication.  Patient has appointment scheduled with Dr. Floretta on 3/2.

## 2024-08-17 ENCOUNTER — Other Ambulatory Visit: Payer: Self-pay

## 2024-08-22 ENCOUNTER — Other Ambulatory Visit: Payer: Self-pay

## 2024-08-24 ENCOUNTER — Other Ambulatory Visit: Payer: Self-pay

## 2024-08-24 NOTE — Progress Notes (Signed)
 Specialty Pharmacy Refill Coordination Note  Lawrence Wang is a 68 y.o. male contacted today regarding refills of specialty medication(s) Apremilast  (Otezla )   Patient requested [name] at [facility] at [place] date: 09/01/24   Medication will be filled on: 08/31/24

## 2024-08-31 ENCOUNTER — Other Ambulatory Visit: Payer: Self-pay

## 2024-09-06 ENCOUNTER — Ambulatory Visit (HOSPITAL_COMMUNITY)
Admission: RE | Admit: 2024-09-06 | Discharge: 2024-09-06 | Disposition: A | Discharge status: Home / Self Care | Source: Ambulatory Visit | Attending: Cardiovascular Disease | Admitting: Cardiovascular Disease

## 2024-09-06 DIAGNOSIS — I77819 Aortic ectasia, unspecified site: Secondary | ICD-10-CM | POA: Diagnosis present

## 2024-09-06 MED ORDER — IOHEXOL 350 MG/ML SOLN
100.0000 mL | Freq: Once | INTRAVENOUS | Status: AC | PRN
  Administered 2024-09-06: 100 mL via INTRAVENOUS

## 2024-09-07 ENCOUNTER — Ambulatory Visit: Payer: Self-pay

## 2024-09-07 ENCOUNTER — Ambulatory Visit: Admitting: Family Medicine

## 2024-09-07 ENCOUNTER — Encounter: Payer: Self-pay | Admitting: Family Medicine

## 2024-09-07 VITALS — BP 124/66 | HR 82 | Temp 97.4°F | Wt 211.1 lb

## 2024-09-07 DIAGNOSIS — R300 Dysuria: Secondary | ICD-10-CM | POA: Diagnosis not present

## 2024-09-07 LAB — POC URINALSYSI DIPSTICK (AUTOMATED)
Bilirubin, UA: NEGATIVE
Blood, UA: NEGATIVE
Glucose, UA: NEGATIVE
Ketones, UA: NEGATIVE
Leukocytes, UA: NEGATIVE
Nitrite, UA: NEGATIVE
Protein, UA: NEGATIVE
Spec Grav, UA: <=1.005 — AB
Urobilinogen, UA: 0.2 U/dL
pH, UA: 6.5

## 2024-09-07 NOTE — Telephone Encounter (Signed)
 Appt today at 11:30am with Dr Micheal.

## 2024-09-07 NOTE — Progress Notes (Signed)
 "  Established Patient Office Visit  Subjective   Patient ID: Lawrence Wang, male    DOB: 04/12/1956  Age: 69 y.o. MRN: [id number]  Chief Complaint  Patient presents with   Dysuria    HPI    Mr. Badders is seen with onset yesterday of some burning with urination.  He has some chills and took temperature of 99.8.  Has taken some Tylenol  today but no fever.  Denies any flank pain, gross hematuria, nausea, vomiting.  No history of prostatitis.  Monogamous and no history of STD.  No history of kidney stones.  Has generally felt well today.  No history of reported UTI.  No history of diabetes.  Past Medical History:  Diagnosis Date   GERD (gastroesophageal reflux disease)    Hypertension    Psoriasis    diagnosed by [facility] per patient   Past Surgical History:  Procedure Laterality Date   APPENDECTOMY     INGUINAL HERNIA REPAIR Bilateral 09/23/2021   Procedure: LAPAROSCOPIC BILATERAL INGUINAL HERNIA REPAIR WITH MESH;  Surgeon: Rubin Calamity, MD;  Location: [facility] OR;  Service: General;  Laterality: Bilateral;   INSERTION OF MESH Bilateral 09/23/2021   Procedure: INSERTION OF MESH;  Surgeon: Rubin Calamity, MD;  Location: [facility] OR;  Service: General;  Laterality: Bilateral;   NASAL SEPTUM SURGERY     TONSILLECTOMY     UVULECTOMY      reports that he has never smoked. He has never used smokeless tobacco. He reports current alcohol use. He reports that he does not use drugs. family history includes CAD (age of onset: 87) in his father; Cervical cancer (age of onset: 16) in his mother; Heart attack in his maternal grandfather; Heart block in his father; High blood pressure in his mother; Lung cancer in his brother; Throat cancer in his paternal grandmother; Thyroid disease in his mother. Allergies[1]  Review of Systems  Constitutional:  Positive for chills.  Gastrointestinal:  Negative for abdominal pain, nausea and vomiting.  Genitourinary:  Positive for dysuria and frequency.  Negative for flank pain and hematuria.      Objective:     BP 124/66   Pulse 82   Temp (!) 97.4 F (36.3 C) (Oral)   Wt 211 lb 1.6 oz (95.8 kg)   SpO2 98%   BMI 25.97 kg/m  BP Readings from Last 3 Encounters:  09/07/24 124/66  02/08/24 120/82  10/23/23 130/88   Wt Readings from Last 3 Encounters:  09/07/24 211 lb 1.6 oz (95.8 kg)  02/08/24 218 lb 9.6 oz (99.2 kg)  10/23/23 222 lb (100.7 kg)      Physical Exam Vitals reviewed.  Constitutional:      General: He is not in acute distress.    Appearance: He is not ill-appearing.  Cardiovascular:     Rate and Rhythm: Normal rate and regular rhythm.  Pulmonary:     Effort: Pulmonary effort is normal.     Breath sounds: Normal breath sounds.  Neurological:     Mental Status: He is alert.      Results for orders placed or performed in visit on 09/07/24  POCT Urinalysis Dipstick (Automated)  Result Value Ref Range   Color, UA Yellow    Clarity, UA Clear    Glucose, UA Negative Negative   Bilirubin, UA Negative    Ketones, UA Negative    Spec Grav, UA <=1.005 (A) 1.010 - 1.025   Blood, UA Negative    pH, UA 6.5 5.0 -  8.0   Protein, UA Negative Negative   Urobilinogen, UA 0.2 0.2 or 1.0 E.U./dL   Nitrite, UA Negative    Leukocytes, UA Negative Negative      The ASCVD Risk score (Arnett DK, et al., 2019) failed to calculate for the following reasons:   The valid total cholesterol range is 130 to 320 mg/dL    Assessment & Plan:   Problem List Items Addressed This Visit   None Visit Diagnoses       Dysuria    -  Primary   Relevant Orders   POCT Urinalysis Dipstick (Automated) (Completed)   Urine Culture     Patient seen with 1 day history of mild burning with urination and mild elevated temperature yesterday.  Nontoxic in appearance.  Urine dipstick is completely normal which makes infection very unlikely.  We decided to send urine culture to be sure.  Stay well-hydrated.  Follow-up promptly for any  fever or other concerns.  No follow-ups on file.    Wolm Scarlet, MD     [1]  Allergies Allergen Reactions   Cipro  [Ciprofloxacin  Hcl] Other (See Comments)    Hx of aortic aneurism     Peanuts [Peanut Oil] Other (See Comments)    Headaches   Zithromax [Azithromycin] Other (See Comments)    Hiccups-per patient   "

## 2024-09-07 NOTE — Telephone Encounter (Signed)
 FYI Only or Action Required?: FYI only for provider: appointment scheduled on 09/07/24.  Patient was last seen in primary care on 02/08/2024 by Mercer Clotilda SAUNDERS, MD.  Called Nurse Triage reporting Dysuria.  Symptoms began today.  Interventions attempted: Rest, hydration, or home remedies.  Symptoms are: stable.  Triage Disposition: See Physician Within 24 Hours  Patient/caregiver understands and will follow disposition?: Yes                  Reason for Disposition  All other males with painful urination  Answer Assessment - Initial Assessment Questions Patient reports last few days been tired, crappy feeling. Yesterday started with pain with urination this morning woke up fever of about 99.8. assuming bladder infection. Pain 6/10 with urination. Every time urinated has had the pain and has urinated several time this morning. Booked same day visit at PCP office with alternative provider  at 1130 AM     1. SEVERITY: How bad is the pain?  (e.g., Scale 1-10; mild, moderate, or severe)     6/10  2. FREQUENCY: How many times have you had painful urination today?      Little bit  3. PATTERN: Is pain present every time you urinate or just sometimes?      Every time  4. ONSET: When did the painful urination start?      This morning  5. FEVER: Do you have a fever? If Yes, ask: What is your temperature, how was it measured, and when did it start?     This morning reported 99.8 6. PAST UTI: Have you had a urine infection before? If Yes, ask: When was the last time? and What happened that time?      Has had in the past  7. CAUSE: What do you think is causing the painful urination?      Unknown   8. OTHER SYMPTOMS: Do you have any other symptoms? (e.g., flank pain, penis discharge, scrotal pain, blood in urine)     Patient reports feeling sort of crappy past few days   Patient reports the following chest pain , shortness of breath, flank pain lower back  pain, No blood in urine, swelling scrotum , vomiting  Protocols used: Urination Pain - Male-A-AH  Copied from CRM #[id number]. Topic: Clinical - Red Word Triage >> Sep 07, 2024  8:04 AM Amy B wrote: Red Word that prompted transfer to Nurse Triage: Fever, pain with urination, frequency

## 2024-09-07 NOTE — Telephone Encounter (Signed)
 Noted- ok to close.

## 2024-09-08 ENCOUNTER — Ambulatory Visit: Payer: Self-pay | Admitting: Family Medicine

## 2024-09-08 DIAGNOSIS — R509 Fever, unspecified: Secondary | ICD-10-CM

## 2024-09-08 LAB — URINE CULTURE
MICRO NUMBER:: 17467983
Result:: NO GROWTH
SPECIMEN QUALITY:: ADEQUATE

## 2024-09-09 ENCOUNTER — Other Ambulatory Visit

## 2024-09-09 ENCOUNTER — Other Ambulatory Visit: Payer: Self-pay | Admitting: Family Medicine

## 2024-09-09 DIAGNOSIS — R509 Fever, unspecified: Secondary | ICD-10-CM

## 2024-09-10 ENCOUNTER — Emergency Department (HOSPITAL_BASED_OUTPATIENT_CLINIC_OR_DEPARTMENT_OTHER)
Admission: EM | Admit: 2024-09-10 | Discharge: 2024-09-10 | Disposition: A | Discharge status: Home / Self Care | Attending: Emergency Medicine | Admitting: Emergency Medicine

## 2024-09-10 ENCOUNTER — Encounter (HOSPITAL_BASED_OUTPATIENT_CLINIC_OR_DEPARTMENT_OTHER): Payer: Self-pay

## 2024-09-10 ENCOUNTER — Other Ambulatory Visit: Payer: Self-pay

## 2024-09-10 DIAGNOSIS — N3000 Acute cystitis without hematuria: Secondary | ICD-10-CM | POA: Diagnosis not present

## 2024-09-10 DIAGNOSIS — R300 Dysuria: Secondary | ICD-10-CM | POA: Diagnosis present

## 2024-09-10 DIAGNOSIS — Z91010 Allergy to peanuts: Secondary | ICD-10-CM | POA: Insufficient documentation

## 2024-09-10 LAB — CBC WITH DIFFERENTIAL/PLATELET
Abs Immature Granulocytes: 0.01 K/uL (ref 0.00–0.07)
Basophils Absolute: 0.1 x10E3/uL (ref 0.0–0.2)
Basophils Absolute: 0 K/uL (ref 0.0–0.1)
Basophils Relative: 0 %
Basos: 0 %
EOS (ABSOLUTE): 0 x10E3/uL (ref 0.0–0.4)
Eos: 0 %
Eosinophils Absolute: 0.1 K/uL (ref 0.0–0.5)
Eosinophils Relative: 1 %
HCT: 43.2 % (ref 39.0–52.0)
Hematocrit: 44.4 % (ref 37.5–51.0)
Hemoglobin: 14.5 g/dL (ref 13.0–17.7)
Hemoglobin: 14.3 g/dL (ref 13.0–17.0)
Immature Grans (Abs): 0 x10E3/uL (ref 0.0–0.1)
Immature Granulocytes: 0 %
Immature Granulocytes: 0 %
Lymphocytes Absolute: 1.9 x10E3/uL (ref 0.7–3.1)
Lymphocytes Relative: 28 %
Lymphs Abs: 2.3 K/uL (ref 0.7–4.0)
Lymphs: 13 %
MCH: 29.6 pg (ref 26.6–33.0)
MCH: 29.6 pg (ref 26.0–34.0)
MCHC: 32.7 g/dL (ref 31.5–35.7)
MCHC: 33.1 g/dL (ref 30.0–36.0)
MCV: 91 fL (ref 79–97)
MCV: 89.4 fL (ref 80.0–100.0)
Monocytes Absolute: 1.3 x10E3/uL — ABNORMAL HIGH (ref 0.1–0.9)
Monocytes Absolute: 0.8 K/uL (ref 0.1–1.0)
Monocytes Relative: 10 %
Monocytes: 9 %
Neutro Abs: 4.9 K/uL (ref 1.7–7.7)
Neutrophils Absolute: 11 x10E3/uL — ABNORMAL HIGH (ref 1.4–7.0)
Neutrophils Relative %: 61 %
Neutrophils: 78 %
Platelets: 267 x10E3/uL (ref 150–450)
Platelets: 248 K/uL (ref 150–400)
RBC: 4.9 x10E6/uL (ref 4.14–5.80)
RBC: 4.83 MIL/uL (ref 4.22–5.81)
RDW: 11.8 % (ref 11.6–15.4)
RDW: 12.2 % (ref 11.5–15.5)
WBC: 14.4 x10E3/uL — ABNORMAL HIGH (ref 3.4–10.8)
WBC: 8.1 K/uL (ref 4.0–10.5)
nRBC: 0 % (ref 0.0–0.2)

## 2024-09-10 LAB — URINALYSIS, ROUTINE W REFLEX MICROSCOPIC
Bacteria, UA: NONE SEEN
Bilirubin Urine: NEGATIVE
Glucose, UA: NEGATIVE mg/dL
Hgb urine dipstick: NEGATIVE
Ketones, ur: NEGATIVE mg/dL
Nitrite: NEGATIVE
Protein, ur: 30 mg/dL — AB
Specific Gravity, Urine: 1.033 — ABNORMAL HIGH (ref 1.005–1.030)
WBC, UA: >50 WBC/hpf (ref 0–5)
pH: 6 (ref 5.0–8.0)

## 2024-09-10 LAB — BASIC METABOLIC PANEL WITH GFR
Anion gap: 12 (ref 5–15)
BUN: 13 mg/dL (ref 8–23)
CO2: 28 mmol/L (ref 22–32)
Calcium: 9.7 mg/dL (ref 8.9–10.3)
Chloride: 99 mmol/L (ref 98–111)
Creatinine, Ser: 0.8 mg/dL (ref 0.61–1.24)
GFR, Estimated: >60 mL/min
Glucose, Bld: 91 mg/dL (ref 70–99)
Potassium: 4 mmol/L (ref 3.5–5.1)
Sodium: 139 mmol/L (ref 135–145)

## 2024-09-10 LAB — LACTIC ACID, PLASMA: Lactic Acid, Venous: 1.5 mmol/L (ref 0.5–1.9)

## 2024-09-10 MED ORDER — SODIUM CHLORIDE 0.9 % IV SOLN
1.0000 g | Freq: Once | INTRAVENOUS | Status: AC
  Administered 2024-09-10: 1 g via INTRAVENOUS
  Filled 2024-09-10: qty 10

## 2024-09-10 MED ORDER — SULFAMETHOXAZOLE-TRIMETHOPRIM 800-160 MG PO TABS
1.0000 | ORAL_TABLET | Freq: Two times a day (BID) | ORAL | 0 refills | Status: AC
  Filled 2024-09-10: qty 14, 7d supply, fill #0

## 2024-09-10 NOTE — ED Triage Notes (Signed)
 Pt reports UTI S/S x4 days. Pt reports burning with urination, low grade fever, fatigue. Pt reports seeing PCP and UA was negative.

## 2024-09-10 NOTE — Discharge Instructions (Addendum)
 Your lab testing was reassuring with normal blood cell counts, electrolytes and kidney function.  Your urine test does show signs of infection today.  For this reason we repeated a urine culture.  Given your fevers, we treated you with an IV dose of Rocephin .  You have been prescribed an antibiotic, trimethoprim /sulfamethoxazole , for UTI.  Will plan to avoid ciprofloxacin  due to your known aortic aneurysm.  Follow-up with your primary care doctor will be important to follow-up on culture results, to ensure that trimethoprim /sulfamethoxazole  is an appropriate antibiotic.  If your doctor is concerned about prostatitis, you may need a longer course of antibiotic as well.  Return to the emergency department if you have persistent high fever, persistent vomiting, you have worsening symptoms or other concerns.

## 2024-09-10 NOTE — ED Provider Notes (Signed)
 " Lawrence Wang Provider Note   CSN: [id number] Arrival date & time: 09/10/24  1535     Patient presents with: Urinary Tract Infection   Lawrence Wang is a 69 y.o. male.   Patient with history of psoriasis on apremilast  --presents to the emergency department for evaluation of dysuria over approximately the past 4 days.  He has had intermittent fevers and chills.  No nausea or vomiting.  No anterior abdominal pain or back pain.  States that he has seen his primary care doctor.  He had UA which was reassuring, urine culture which was actually negative.  He had a CBC showing elevated white blood cell count.  He came into the emergency department as he was not feeling better and he could not see his doctor until Monday.  Reports last documented fever was around first thing this morning.  Otherwise has been very fatigued.  No history of diverticulitis or other abdominal surgeries, infections.  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       Prior to Admission medications  Medication Sig Start Date End Date Taking? Authorizing Provider  Apremilast  (OTEZLA ) 30 MG TABS Take 1 tablet by mouth twice a day 12/22/23   Jegede, Olugbemiga E, MD  atorvastatin  (LIPITOR) 40 MG tablet Take 1 tablet (40 mg total) by mouth daily. 08/08/24 11/06/24  Floretta Mallard, MD  benazepril  (LOTENSIN ) 10 MG tablet Take 1 tablet (10 mg total) by mouth daily. Patient needs appointment. 05/16/24 05/16/25  Ozell Heron HERO, MD  calcipotriene  (DOVONOX) 0.005 % ointment Apply small amount to skin twice a day 03/20/22     ezetimibe  (ZETIA ) 10 MG tablet Take 1 tablet (10 mg total) by mouth daily. 11/24/23   Nahser, Aleene PARAS, MD  Flaxseed, Linseed, (FLAX SEEDS PO) Take 1 capsule by mouth daily.    [provider]  Multiple Vitamin (MULTIVITAMIN) capsule Take 1 capsule by mouth daily.    [provider]  omeprazole   (PRILOSEC) 20 MG capsule Take 1 capsule (20 mg total) by mouth every other day. 03/28/19   Koberlein, Junell C, MD  sildenafil  (VIAGRA ) 25 MG tablet TAKE 1-3 TABLETS BY MOUTH AS NEEDED PRIOR TO SEXUAL INTERCOURSE. DO NOT USE MORE THEN 1 DOSE IN 24 HOURS 07/11/24   Ozell Heron HERO, MD    Allergies: Cipro  [ciprofloxacin  hcl], Peanuts [peanut oil], and Zithromax [azithromycin]    Review of Systems  Updated Vital Signs BP (!) 139/92 (BP Location: Right Arm)   Pulse 85   Temp 98.5 F (36.9 C) (Oral)   Resp 15   Ht 6' 3 (1.905 m)   Wt 95.8 kg   SpO2 100%   BMI 26.40 kg/m   Physical Exam Vitals and nursing note reviewed.  Constitutional:      General: He is not in acute distress.    Appearance: He is well-developed.  HENT:     Head: Normocephalic and atraumatic.     Mouth/Throat:     Mouth: Mucous membranes are moist.  Eyes:     General:        Right eye: No discharge.        Left eye: No discharge.     Conjunctiva/sclera: Conjunctivae normal.  Cardiovascular:     Rate and Rhythm: Normal rate and regular rhythm.     Heart sounds: Normal heart sounds.  Pulmonary:     Effort: Pulmonary effort is normal.  Breath sounds: Normal breath sounds.  Abdominal:     Palpations: Abdomen is soft.     Tenderness: There is no abdominal tenderness. There is no right CVA tenderness or left CVA tenderness.     Comments: No suprapubic tenderness.  Musculoskeletal:     Cervical back: Normal range of motion and neck supple.  Skin:    General: Skin is warm and dry.  Neurological:     Mental Status: He is alert.     (all labs ordered are listed, but only abnormal results are displayed) Labs Reviewed  URINALYSIS, ROUTINE W REFLEX MICROSCOPIC - Abnormal; Notable for the following components:      Result Value   Specific Gravity, Urine 1.033 (*)    Protein, ur 30 (*)    Leukocytes,Ua MODERATE (*)    All other components within normal limits  URINE CULTURE     EKG: None  Radiology: No results found.   Procedures   Medications Ordered in the ED  cefTRIAXone  (ROCEPHIN ) 1 g in sodium chloride  0.9 % 100 mL IVPB (has no administration in time range)   ED Course  Patient seen and examined. History obtained directly from patient.   Labs/EKG: Ordered CBC, BMP, lactic x 1.  Will check blood cultures if he develops a fever here, otherwise we will recheck urine culture.  Imaging: None ordered  Medications/Fluids: Ordered: IV ceftriaxone .   Most recent vital signs reviewed and are as follows: BP (!) 139/92 (BP Location: Right Arm)   Pulse 85   Temp 98.5 F (36.9 C) (Oral)   Resp 15   Ht 6' 3 (1.905 m)   Wt 95.8 kg   SpO2 100%   BMI 26.40 kg/m   Initial impression: Patient with dysuria, intermittent fevers.  UA today does show pyuria.  Patient looks well, nontoxic.  Given immunomodulatory medication, febrile UTI --will check labs and give a dose of IV antibiotics.  Anticipate that he can go home with close outpatient follow-up given his well appearance.  6:14 PM Reassessment performed. Patient appears very well.  Labs personally reviewed and interpreted including: CBC shows improvement in white blood cell count to 8.1, normal hemoglobin; BMP is unremarkable; lactate is normal at 1.5.   Reviewed pertinent lab work and imaging with patient at bedside. Questions answered.   Most current vital signs reviewed and are as follows: BP (!) 144/89 (BP Location: Left Arm)   Pulse 77   Temp 98.6 F (37 C) (Oral)   Resp 14   Ht 6' 3 (1.905 m)   Wt 95.8 kg   SpO2 98%   BMI 26.40 kg/m   Plan: Discharge to home.    Prescriptions written for: Trimethoprim /sulfamethoxazole  (relative contraindication to Cipro  due to known aortic aneurysm undergoing monitoring)  Other home care instructions discussed: OTC meds for fever control and pain  ED return instructions discussed: Return with high fever, persistent vomiting, worsening pain,  worsening condition.  Follow-up instructions discussed: Patient encouraged to follow-up with their PCP in 3 days.                                     Medical Decision Making Amount and/or Complexity of Data Reviewed Labs: ordered.  Risk Prescription drug management.   Patient with dysuria and intermittent fevers.  UA today suggestive of infection.  Lab workup is very reassuring.  Vital signs are normal.  Low concern for sepsis given normal  lactic acid, no hypotension or tachycardia.  Do not feel that patient requires admission at this time.  Given concurrent medications and fevers, he was given a dose of IV Rocephin .  Will discharge to home on Bactrim .  Other than fever, no symptoms suggestive of prostatitis.  Urine culture pending.  He has good outpatient follow-up.  No abdominal pain and I do not feel that patient requires advanced imaging at this time.  The patient's vital signs, pertinent lab work and imaging were reviewed and interpreted as discussed in the ED course. Hospitalization was considered for further testing, treatments, or serial exams/observation. However as patient is well-appearing, has a stable exam, and reassuring studies today, I do not feel that they warrant admission at this time. This plan was discussed with the patient who verbalizes agreement and comfort with this plan and seems reliable and able to return to the Emergency Department with worsening or changing symptoms.         Final diagnoses:  Acute cystitis without hematuria    ED Discharge Orders          Ordered    sulfamethoxazole -trimethoprim  (BACTRIM  DS) 800-160 MG tablet  2 times daily        09/10/24 1811               Desiderio Chew, PA-C 09/10/24 1816    Kingsley, Victoria K, DO 09/10/24 2314  "

## 2024-09-11 ENCOUNTER — Other Ambulatory Visit (HOSPITAL_COMMUNITY): Payer: Self-pay

## 2024-09-11 LAB — URINE CULTURE: Culture: NO GROWTH

## 2024-09-12 ENCOUNTER — Ambulatory Visit: Payer: Self-pay | Admitting: Family Medicine

## 2024-09-13 ENCOUNTER — Telehealth: Payer: Self-pay

## 2024-09-13 DIAGNOSIS — E785 Hyperlipidemia, unspecified: Secondary | ICD-10-CM

## 2024-09-13 NOTE — Telephone Encounter (Signed)
 Copied from CRM [id number]. Topic: Clinical - Request for Lab/Test Order >> Sep 12, 2024  3:59 PM Alexandria E wrote: Reason for CRM: Patient would like labs done prior to his physical that is on 2/26. Please call patient once orders are entered, if this is doable.

## 2024-09-14 NOTE — Telephone Encounter (Signed)
 Orders placed for lipid panel and hepatic function panel- pt had the rest of his blood work done already.

## 2024-09-14 NOTE — Addendum Note (Signed)
 Addended by: Nelia Rogoff M on: 09/14/2024 08:19 AM   Modules accepted: Orders

## 2024-09-14 NOTE — Telephone Encounter (Signed)
 Left a detailed message with the information below at the patient's cell number.

## 2024-09-15 ENCOUNTER — Ambulatory Visit: Payer: Self-pay | Admitting: Cardiology

## 2024-09-23 ENCOUNTER — Other Ambulatory Visit: Payer: Self-pay

## 2024-09-29 ENCOUNTER — Other Ambulatory Visit (HOSPITAL_COMMUNITY): Payer: Self-pay

## 2024-09-29 MED ORDER — AZELAIC ACID 15 % EX GEL
1.0000 | Freq: Two times a day (BID) | CUTANEOUS | 3 refills | Status: AC
  Filled 2024-09-29: qty 50, 30d supply, fill #0

## 2024-09-30 ENCOUNTER — Other Ambulatory Visit: Payer: Self-pay

## 2024-10-19 ENCOUNTER — Encounter: Payer: Commercial Managed Care - PPO | Admitting: Family Medicine

## 2024-10-20 ENCOUNTER — Encounter: Admitting: Family Medicine

## 2024-10-24 ENCOUNTER — Ambulatory Visit: Admitting: Student in an Organized Health Care Education/Training Program
